# Patient Record
Sex: Male | Born: 1980
Health system: Southern US, Community
[De-identification: ages and names within clinical notes are randomized; demographics above are authoritative.]

## PROBLEM LIST (undated history)

## (undated) DIAGNOSIS — E119 Type 2 diabetes mellitus without complications: Secondary | ICD-10-CM

## (undated) HISTORY — DX: Type 2 diabetes mellitus without complications: E11.9

## (undated) HISTORY — PX: TONSILLECTOMY: SUR1361

---

## 2011-06-30 ENCOUNTER — Ambulatory Visit
Admission: RE | Admit: 2011-06-30 | Discharge: 2011-06-30 | Disposition: A | Discharge status: Home / Self Care | Payer: BC Managed Care – PPO | Source: Ambulatory Visit | Attending: Otolaryngology | Admitting: Otolaryngology

## 2011-06-30 ENCOUNTER — Other Ambulatory Visit: Payer: Self-pay | Admitting: Otolaryngology

## 2013-08-02 IMAGING — CT CT MAXILLOFACIAL W/O CM
3 series · 16 of 30 positions shown, 18 images · non-contrast
Comparison: None.

CLINICAL DATA: Chronic sinusitis.  Difficulty breathing through
nasal passages.  Nasal swelling.

CT MAXILLOFACIAL WITHOUT CONTRAST
TECHNIQUE: Multidetector CT imaging of the maxillofacial
structures was performed. Multiplanar CT image reconstructions were
also generated.

[Series 3: axial soft 1.25 · axial · 0.49mm/px · z∈[-89,-2]mm · 4 of 181 slices shown]
[im 14/181  brain]
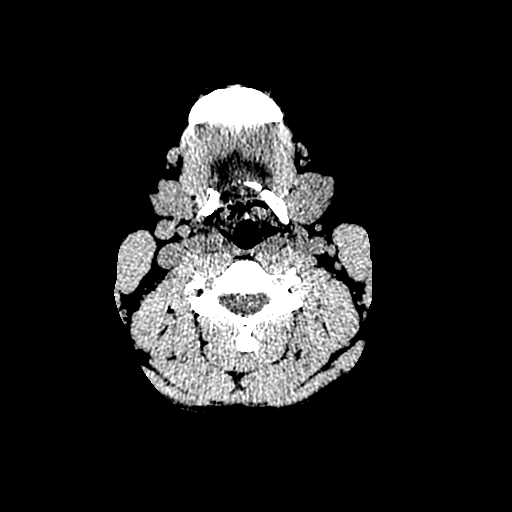
[im 42/181  brain]
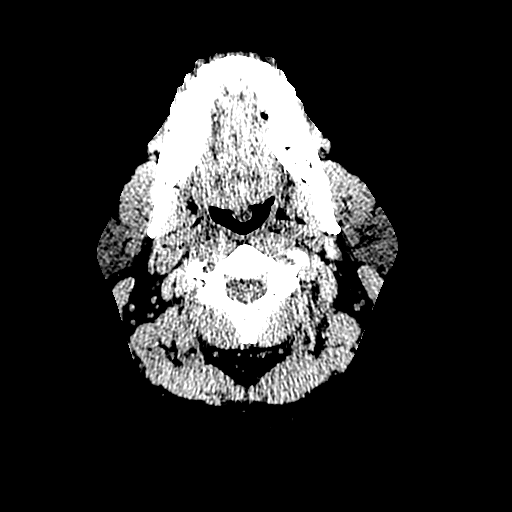
[im 56/181  brain]
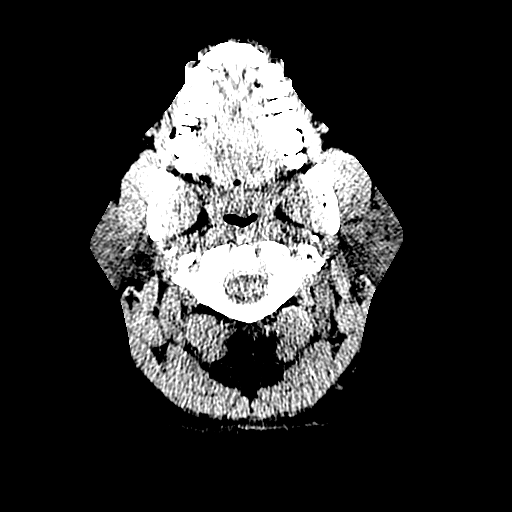
[im 84/181  brain]
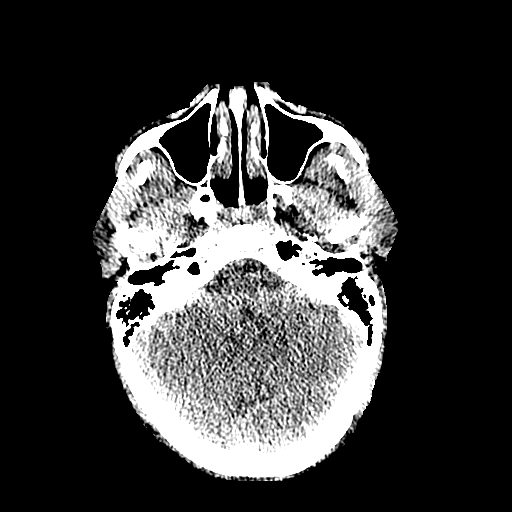

[Series 200: cor · coronal · 0.49mm/px · 7 of 120 slices shown, 9 images]
[im 15/120  brain]
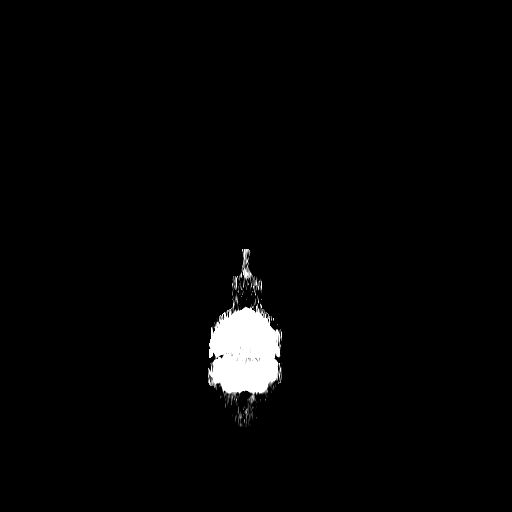
[im 15/120  bone]
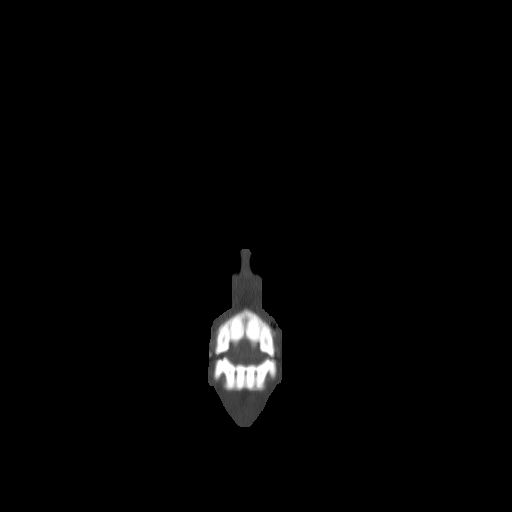
[im 30/120  bone]
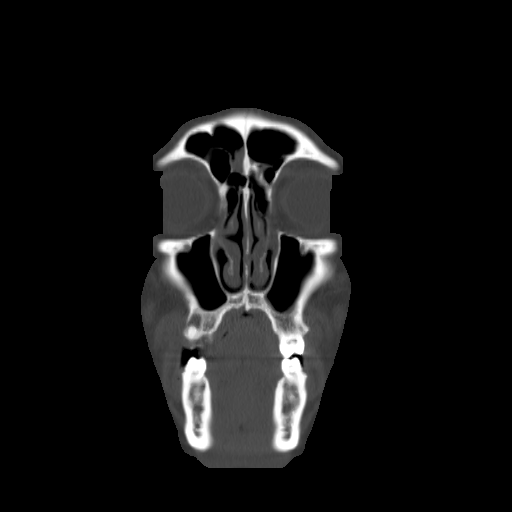
[im 45/120  bone]
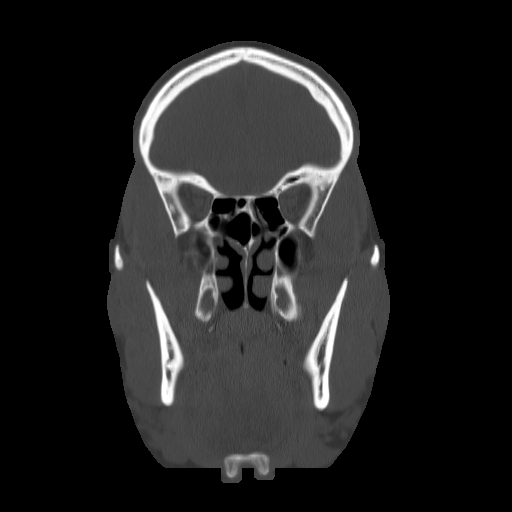
[im 60/120  bone]
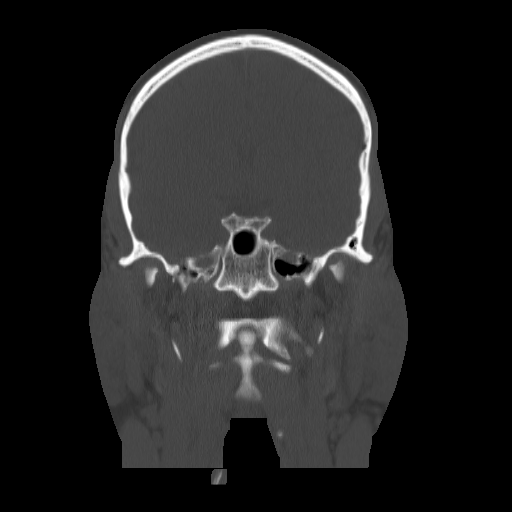
[im 75/120  brain]
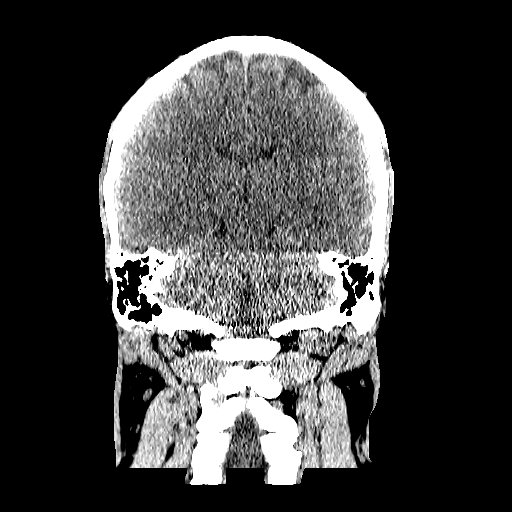
[im 75/120  bone]
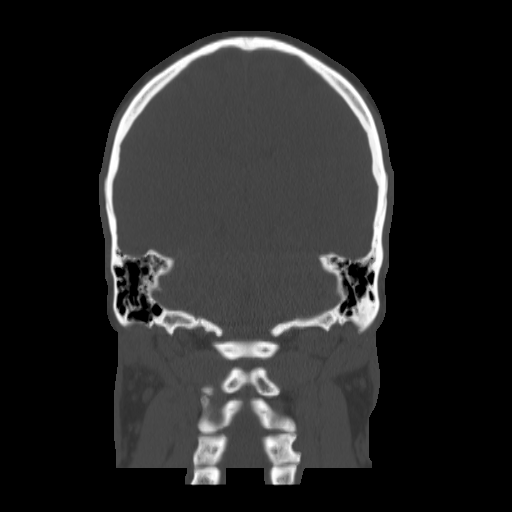
[im 90/120  bone]
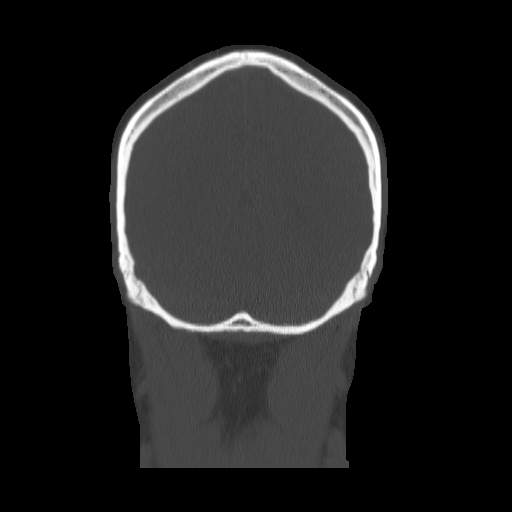
[im 105/120  bone]
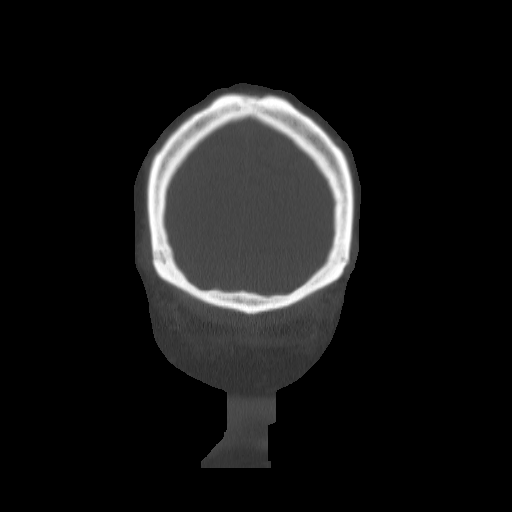

[Series 201: sag · sagittal · 0.49mm/px · 5 of 89 slices shown]
[im 15/89  bone]
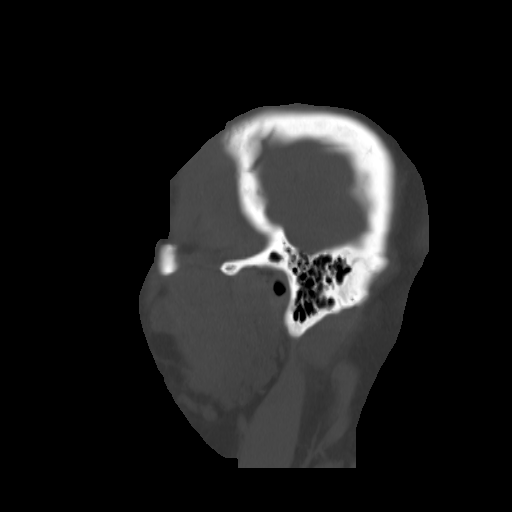
[im 30/89  bone]
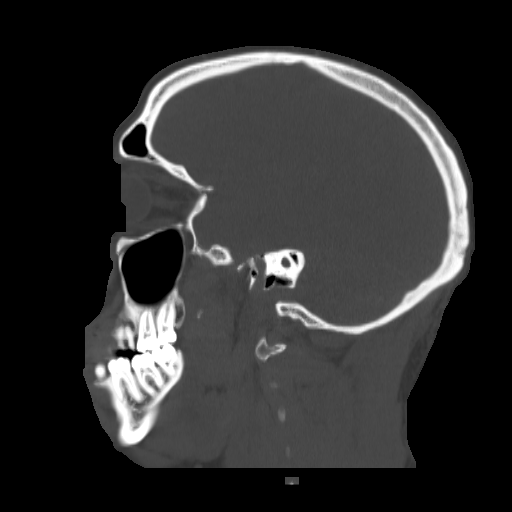
[im 45/89  bone]
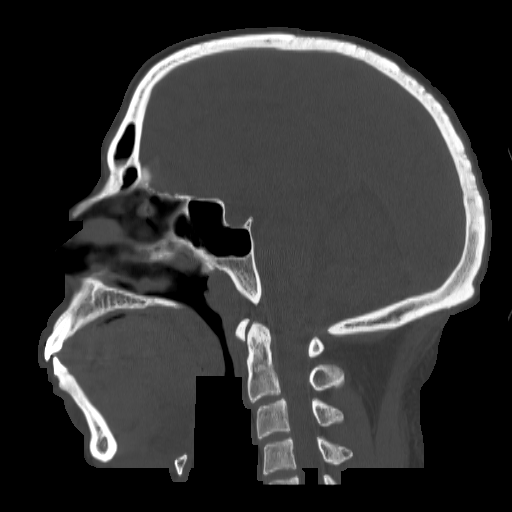
[im 59/89  bone]
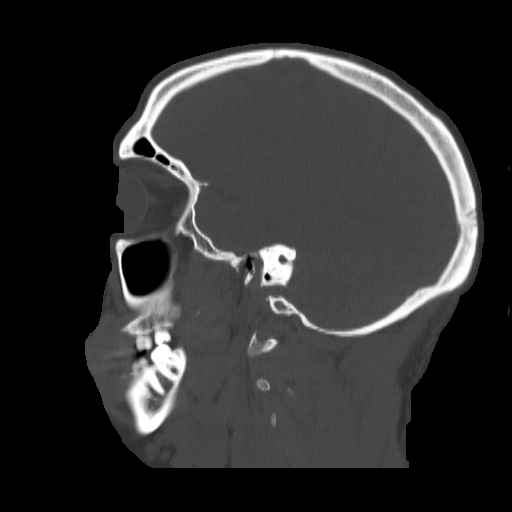
[im 74/89  bone]
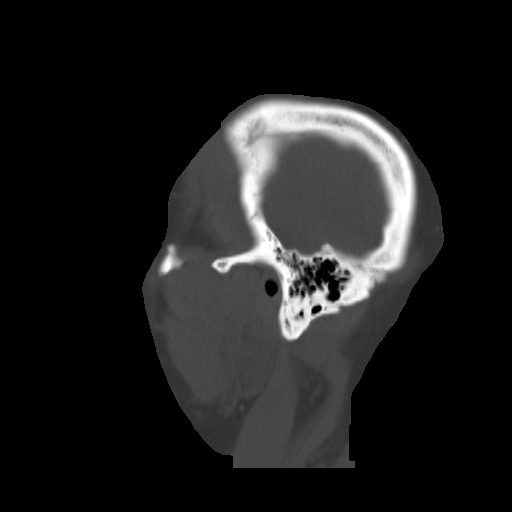

[16 of 30 positions shown; findings below may reference images not displayed]

FINDINGS: Right maxillary sinus is clear.  Minimal mucosal
thickening inferior aspect left maxillary sinus.

Right middle turbinate contra bullosa.  Slightly vertical position
of the uncinate process bilaterally.  Infundibulum appears to be
patent bilaterally.

Mid nasal spur to the left.

Prominent ethmoid sinus air cells extends into the frontal sinuses
more notable on the right.  Partial opacification medial aspect of
the frontal sinuses greater on the right.

Very minimal mucosal thickening involving portions of the ethmoid
sinus air cells

Right sphenoid sinus air cells is dominant in size with aeration of
the upper aspect of the right pterygoid plate.  No sphenoid sinus
mucosal thickening.

Right upper molar periapical lucency.

Scattered lymph nodes, largest just below the right submandibular
gland measures 1.3 x 1 x 2 cm.

Mastoid air cells and middle ear cavity are clear bilaterally.
IMPRESSION: Right maxillary sinus is clear.  Minimal mucosal thickening
inferior aspect left maxillary sinus.

Right middle turbinate contra bullosa.  Slightly vertical position
of the uncinate process bilaterally.  Infundibulum appears to be
patent bilaterally.

Mid nasal spur to the left.

Prominent ethmoid sinus air cells extends into the frontal sinuses
more notable on the right.  Partial opacification medial aspect of
the frontal sinuses greater on the right.

Very minimal mucosal thickening involving portions of the ethmoid
sinus air cells

Right sphenoid sinus air cells is dominant in size with aeration of
the upper aspect of the right pterygoid plate.  No sphenoid sinus
mucosal thickening.

Right upper molar periapical lucency.

Scattered lymph nodes, largest just below the right submandibular
gland measures 1.3 x 1 x 2 cm.

## 2014-12-09 ENCOUNTER — Emergency Department (HOSPITAL_BASED_OUTPATIENT_CLINIC_OR_DEPARTMENT_OTHER)
Admission: EM | Admit: 2014-12-09 | Discharge: 2014-12-09 | Disposition: A | Discharge status: Home / Self Care | Payer: BC Managed Care – PPO | Attending: Emergency Medicine | Admitting: Emergency Medicine

## 2014-12-09 ENCOUNTER — Encounter (HOSPITAL_BASED_OUTPATIENT_CLINIC_OR_DEPARTMENT_OTHER): Payer: Self-pay | Admitting: *Deleted

## 2014-12-09 DIAGNOSIS — J029 Acute pharyngitis, unspecified: Secondary | ICD-10-CM | POA: Insufficient documentation

## 2014-12-09 DIAGNOSIS — H109 Unspecified conjunctivitis: Secondary | ICD-10-CM | POA: Insufficient documentation

## 2014-12-09 MED ORDER — MAGIC MOUTHWASH
10.0000 mL | Freq: Once | ORAL | Status: AC
  Administered 2014-12-09: 10 mL via ORAL
  Filled 2014-12-09: qty 10

## 2014-12-09 MED ORDER — IBUPROFEN 800 MG PO TABS
800.0000 mg | ORAL_TABLET | Freq: Once | ORAL | Status: AC
  Administered 2014-12-09: 800 mg via ORAL
  Filled 2014-12-09: qty 1

## 2014-12-09 NOTE — Discharge Instructions (Signed)

## 2014-12-09 NOTE — ED Provider Notes (Signed)
CSN: [id number]     Arrival date & time 12/09/14  1719 History  By signing my name below, I, Budd PalmerVanessa Prueter, attest that this documentation has been prepared under the direction and in the presence of Margarita Grizzleanielle Anush Wiedeman, MD. Electronically Signed: Budd PalmerVanessa Prueter, ED Scribe. 12/09/2014. 6:15 PM.    Chief Complaint  Patient presents with  . Oral Swelling   The history is provided by the patient. No language interpreter was used.   HPI Comments: Brian Medina is a 34 y.o. male who presents to the Emergency Department complaining of worsening oral swelling onset 3 days ago.He reports associated swelling to the right eye, increased discharge and redness, as well as a sore throat and pain with swallowing. He states he thought he had thrush and rinsed his mouth with hydrogen peroxide and salt water. He notes he was seen for his right eye irritation through video chat at [facility], where he was prescribed eye drops for pink eye. He denies any other medical issues. He states he works at [employer] as well as part-time at [employer]. He notes he drinks alcohol socially, but has not done so in a long time. He states he is sexually active with male partners. He reports a PMHx of STDs (trichomonins) a few years ago. He notes having no sexual partners in the past year. Pt denies cough.  History reviewed. No pertinent past medical history. Past Surgical History  Procedure Laterality Date  . Tonsillectomy     No family history on file. Social History  Substance Use Topics  . Smoking status: Never Smoker   . Smokeless tobacco: Never Used  . Alcohol Use: No    Review of Systems  HENT: Positive for facial swelling, sore throat and trouble swallowing.   Eyes: Positive for pain, discharge, redness and itching.  Respiratory: Negative for cough.   All other systems reviewed and are negative.   Allergies  Other  Home Medications   Prior to Admission medications   Not on File   BP 130/92 mmHg  Pulse 104   Temp(Src) 99.8 F (37.7 C) (Oral)  Resp 20  Ht 6' (1.829 m)  Wt 140 lb (63.504 kg)  BMI 18.98 kg/m2  SpO2 98% Physical Exam  Constitutional: He is oriented to person, place, and time. He appears well-developed and well-nourished.  HENT:  Head: Normocephalic and atraumatic.  Right Ear: External ear normal.  Left Ear: External ear normal.  Nose: Nose normal.  Mouth/Throat: Oropharynx is clear and moist.  Pharyngeal erythema  Eyes: Conjunctivae and EOM are normal. Pupils are equal, round, and reactive to light.  Bilateral conjunctivitis  Neck: Normal range of motion. Neck supple.  Cardiovascular: Normal rate, regular rhythm, normal heart sounds and intact distal pulses.   Pulmonary/Chest: Effort normal and breath sounds normal. No respiratory distress. He has no wheezes. He exhibits no tenderness.  Abdominal: Soft. Bowel sounds are normal. He exhibits no distension and no mass. There is no tenderness. There is no guarding.  Musculoskeletal: Normal range of motion.  Neurological: He is alert and oriented to person, place, and time. He has normal reflexes. He exhibits normal muscle tone. Coordination normal.  Skin: Skin is warm and dry.  Psychiatric: He has a normal mood and affect. His behavior is normal. Judgment and thought content normal.  Nursing note and vitals reviewed.   ED Course  Procedures  DIAGNOSTIC STUDIES: Oxygen Saturation is 98% on RA, normal by my interpretation.    COORDINATION OF CARE: 6:13 PM - Discussed  plans to order medications to treat for probable pharyngitis. Pt advised of plan for treatment and pt agrees.  Labs Review Labs Reviewed - No data to display  Imaging Review No results found. I have personally reviewed and evaluated these images and lab results as part of my medical decision-making.   EKG Interpretation None      MDM   Final diagnoses:  Pharyngitis  Bilateral conjunctivitis   I personally performed the services described in this  documentation, which was scribed in my presence. The recorded information has been reviewed and considered.   DaniellMargarita Grizzle 12/14/14 [id number]

## 2014-12-09 NOTE — ED Notes (Signed)
Reports treatment for conjunctivitis that began on Wednesday;reports mouth pain and thought he had thrush, so he began using hydrogen peroxide and salt water. Pt now reports lip/mouth swelling; denies difficulty breathing, drooling, fever, n/v/d.

## 2014-12-09 NOTE — ED Notes (Signed)
Patient used peroxide side in mouth to help with the mouth soreness and tingling.  Patient has mouth soreness X 3 days.

## 2014-12-09 NOTE — ED Notes (Signed)
Patient stable and ambulatory.  Patient verbalizes understanding of discharge instructions. 

## 2014-12-10 ENCOUNTER — Encounter (HOSPITAL_BASED_OUTPATIENT_CLINIC_OR_DEPARTMENT_OTHER): Payer: Self-pay | Admitting: *Deleted

## 2014-12-10 ENCOUNTER — Emergency Department (HOSPITAL_BASED_OUTPATIENT_CLINIC_OR_DEPARTMENT_OTHER)
Admission: EM | Admit: 2014-12-10 | Discharge: 2014-12-10 | Disposition: A | Discharge status: Home / Self Care | Payer: BC Managed Care – PPO | Attending: Emergency Medicine | Admitting: Emergency Medicine

## 2014-12-10 DIAGNOSIS — K1379 Other lesions of oral mucosa: Secondary | ICD-10-CM | POA: Insufficient documentation

## 2014-12-10 LAB — RAPID STREP SCREEN (MED CTR MEBANE ONLY): Streptococcus, Group A Screen (Direct): NEGATIVE

## 2014-12-10 LAB — RAPID HIV SCREEN (HIV 1/2 AB+AG)
HIV 1/2 Antibodies: NONREACTIVE
HIV-1 P24 Antigen - HIV24: NONREACTIVE

## 2014-12-10 MED ORDER — ACETAMINOPHEN 325 MG PO TABS
650.0000 mg | ORAL_TABLET | Freq: Once | ORAL | Status: AC | PRN
  Administered 2014-12-10: 650 mg via ORAL

## 2014-12-10 MED ORDER — ACETAMINOPHEN 325 MG PO TABS
ORAL_TABLET | ORAL | Status: AC
  Administered 2014-12-10: 650 mg via ORAL
  Filled 2014-12-10: qty 2

## 2014-12-10 MED ORDER — MAGIC MOUTHWASH: 5.0000 mL | Freq: Three times a day (TID) | ORAL | Status: DC | PRN

## 2014-12-10 NOTE — ED Provider Notes (Signed)
CSN: [id number]     Arrival date & time 12/10/14  1807 History  By signing my name below, I, Ronney LionSuzanne Le, attest that this documentation has been prepared under the direction and in the presence of Marjory Meints, PA-C. Electronically Signed: Ronney LionSuzanne Le, ED Scribe. 12/10/2014. 7:02 PM.      Chief Complaint  Patient presents with  . Sore Throat   The history is provided by the patient. No language interpreter was used.   HPI Comments: Brian Medina is a 34 y.o. male who presents to the Emergency Department complaining of constant, worsening, burning 7/10 lip and mouth pain and swelling despite following treatment instructions after being seen here at the ED yesterday, with a diagnosis of pharyngitis. Patient was also seen for a sore throat, which he states is unchanged. He states he had taken ibuprofen, used a saltwater rinse, and pushed fluids per doctor's orders yesterday but has seen no improvements. Drinking water exacerbates the pain. He requests scraping the white plaque from his tongue to send to a lab to determine what it is.   History reviewed. No pertinent past medical history. Past Surgical History  Procedure Laterality Date  . Tonsillectomy     No family history on file. Social History  Substance Use Topics  . Smoking status: Never Smoker   . Smokeless tobacco: Never Used  . Alcohol Use: No    Review of Systems  HENT: Positive for facial swelling and sore throat.        +oral pain  All other systems reviewed and are negative.    Other  Home Medications   Prior to Admission medications   Medication Sig Start Date End Date Taking? Authorizing Provider  magic mouthwash SOLN Take 5 mLs by mouth 3 (three) times daily as needed for mouth pain. 12/10/14   Marquon Alcala C Danecia Underdown, PA-C   BP 127/79 mmHg  Pulse 115  Temp(Src) 99.7 F (37.6 C) (Oral)  Resp 20  Ht 6' (1.829 m)  Wt 240 lb (108.863 kg)  BMI 32.54 kg/m2  SpO2 96% Physical Exam  Constitutional: He is oriented to person,  place, and time. He appears well-developed and well-nourished. No distress.  HENT:  Head: Normocephalic and atraumatic.  No white patches or plaques noted. Some dryness to pt lips. Oral mucosa normal without lesions.  Eyes: Conjunctivae and EOM are normal.  Neck: Neck supple. No tracheal deviation present.  Cardiovascular: Normal rate.   Pulmonary/Chest: Effort normal. No respiratory distress.  Musculoskeletal: Normal range of motion.  Neurological: He is alert and oriented to person, place, and time.  Skin: Skin is warm and dry.  Psychiatric: He has a normal mood and affect. His behavior is normal.  Nursing note and vitals reviewed.   ED Course  Procedures (including critical care time)  DIAGNOSTIC STUDIES: Oxygen Saturation is 98% on RA, normal by my interpretation.    COORDINATION OF CARE: 6:40 PM - Discussed with pt that the ED does not normally test for tongue plaques and recommended against it. Suspect tongue irritation from high salt concentration of saltwater rinse. Advised pt to continue to gargle saltwater rather than keep it in his mouth, as well as to use a lower concentration of salt. Will give referral to PCP for pt to use as needed. Pt verbalized understanding and agreed to plan.   Labs Review Labs Reviewed  RAPID STREP SCREEN (NOT AT [facility])  CULTURE, GROUP A STREP  RAPID HIV SCREEN (HIV 1/2 AB+AG)   MDM   Final  diagnoses:  Mouth pain    Dorrien Grunder presents with mouth pain and dry lips since yesterday.  Suspect that pt may be overusing a too concentrated salt water solution. Pt advised to take a break from using the salt water. Gave prescription for magic mouthwash should he notice white plaques again, but none were present on exam. During RN interview with pt kept trying to steer conversation toward HIV and asking if he has thrush. Pt then asked if he wanted an HIV test and he said yes. Pt denies any symptoms of STD.  I personally performed the services  described in this documentation, which was scribed in my presence. The recorded information has been reviewed and is accurate.     Anselm Pancoast, PA-C 12/11/14 1421  Marily Memos, MD 12/12/14 1251

## 2014-12-10 NOTE — ED Notes (Signed)
Pt seen here yesterday and dx pharyngitis- C/o mouth pain and states he is feeling worse after following his instructions from yesterday

## 2014-12-10 NOTE — Discharge Instructions (Signed)
You have been seen today for mouth pain. Follow up with PCP as needed and for chronic care of this issue. Return to ED should symptoms worsen.

## 2014-12-13 ENCOUNTER — Encounter (HOSPITAL_BASED_OUTPATIENT_CLINIC_OR_DEPARTMENT_OTHER): Payer: Self-pay

## 2014-12-13 ENCOUNTER — Inpatient Hospital Stay (HOSPITAL_BASED_OUTPATIENT_CLINIC_OR_DEPARTMENT_OTHER)
Admission: EM | Admit: 2014-12-13 | Discharge: 2014-12-15 | DRG: 866 | Disposition: A | Discharge status: Home / Self Care | Payer: Self-pay | Attending: Internal Medicine | Admitting: Internal Medicine

## 2014-12-13 ENCOUNTER — Observation Stay (HOSPITAL_COMMUNITY): Payer: BC Managed Care – PPO

## 2014-12-13 DIAGNOSIS — R509 Fever, unspecified: Secondary | ICD-10-CM | POA: Diagnosis present

## 2014-12-13 DIAGNOSIS — B084 Enteroviral vesicular stomatitis with exanthem: Principal | ICD-10-CM | POA: Diagnosis present

## 2014-12-13 DIAGNOSIS — R042 Hemoptysis: Secondary | ICD-10-CM

## 2014-12-13 DIAGNOSIS — E876 Hypokalemia: Secondary | ICD-10-CM | POA: Diagnosis present

## 2014-12-13 DIAGNOSIS — E871 Hypo-osmolality and hyponatremia: Secondary | ICD-10-CM | POA: Diagnosis present

## 2014-12-13 DIAGNOSIS — H109 Unspecified conjunctivitis: Secondary | ICD-10-CM | POA: Diagnosis present

## 2014-12-13 LAB — COMPREHENSIVE METABOLIC PANEL
ALT: 45 U/L (ref 17–63)
AST: 50 U/L — AB (ref 15–41)
Albumin: 3.5 g/dL (ref 3.5–5.0)
Alkaline Phosphatase: 120 U/L (ref 38–126)
Anion gap: 12 (ref 5–15)
BUN: 16 mg/dL (ref 6–20)
CHLORIDE: 94 mmol/L — AB (ref 101–111)
CO2: 23 mmol/L (ref 22–32)
Calcium: 8.5 mg/dL — ABNORMAL LOW (ref 8.9–10.3)
Creatinine, Ser: 1.07 mg/dL (ref 0.61–1.24)
Glucose, Bld: 159 mg/dL — ABNORMAL HIGH (ref 65–99)
POTASSIUM: 3 mmol/L — AB (ref 3.5–5.1)
SODIUM: 129 mmol/L — AB (ref 135–145)
Total Bilirubin: 1.7 mg/dL — ABNORMAL HIGH (ref 0.3–1.2)
Total Protein: 8.2 g/dL — ABNORMAL HIGH (ref 6.5–8.1)

## 2014-12-13 LAB — CBC WITH DIFFERENTIAL/PLATELET
Band Neutrophils: 10 %
Basophils Absolute: 0.1 10*3/uL (ref 0.0–0.1)
Basophils Relative: 1 %
EOS ABS: 0.4 10*3/uL (ref 0.0–0.7)
Eosinophils Relative: 7 %
HCT: 40 % (ref 39.0–52.0)
Hemoglobin: 13.6 g/dL (ref 13.0–17.0)
LYMPHS ABS: 1.8 10*3/uL (ref 0.7–4.0)
LYMPHS PCT: 31 %
MCH: 28.5 pg (ref 26.0–34.0)
MCHC: 34 g/dL (ref 30.0–36.0)
MCV: 83.9 fL (ref 78.0–100.0)
MONOS PCT: 22 %
Metamyelocytes Relative: 2 %
Monocytes Absolute: 1.3 10*3/uL — ABNORMAL HIGH (ref 0.1–1.0)
Myelocytes: 1 %
NEUTROS PCT: 26 %
Neutro Abs: 2.2 10*3/uL (ref 1.7–7.7)
PLATELETS: 247 10*3/uL (ref 150–400)
RBC: 4.77 MIL/uL (ref 4.22–5.81)
RDW: 12.1 % (ref 11.5–15.5)
WBC: 5.8 10*3/uL (ref 4.0–10.5)

## 2014-12-13 LAB — I-STAT VENOUS BLOOD GAS, ED
Acid-Base Excess: 7 mmol/L — ABNORMAL HIGH (ref 0.0–2.0)
BICARBONATE: 28.8 meq/L — AB (ref 20.0–24.0)
O2 Saturation: 92 %
PO2 VEN: 64 mmHg — AB (ref 30.0–45.0)
Patient temperature: 103.2
TCO2: 30 mmol/L (ref 0–100)
pCO2, Ven: 36.5 mmHg — ABNORMAL LOW (ref 45.0–50.0)
pH, Ven: 7.513 — ABNORMAL HIGH (ref 7.250–7.300)

## 2014-12-13 LAB — CULTURE, GROUP A STREP: Strep A Culture: NEGATIVE

## 2014-12-13 LAB — DIC (DISSEMINATED INTRAVASCULAR COAGULATION)PANEL
D-Dimer, Quant: 0.94 ug/mL-FEU — ABNORMAL HIGH (ref 0.00–0.48)
INR: 1.24 (ref 0.00–1.49)
Platelets: 246 10*3/uL (ref 150–400)
Smear Review: NONE SEEN

## 2014-12-13 LAB — DIC (DISSEMINATED INTRAVASCULAR COAGULATION) PANEL
APTT: 32 s (ref 24–37)
PROTHROMBIN TIME: 15.8 s — AB (ref 11.6–15.2)

## 2014-12-13 LAB — I-STAT CG4 LACTIC ACID, ED: LACTIC ACID, VENOUS: 1.28 mmol/L (ref 0.5–2.0)

## 2014-12-13 LAB — TROPONIN I: Troponin I: <0.03 ng/mL (ref <0.031)

## 2014-12-13 LAB — RAPID STREP SCREEN (MED CTR MEBANE ONLY): Streptococcus, Group A Screen (Direct): NEGATIVE

## 2014-12-13 MED ORDER — ACETAMINOPHEN 160 MG/5ML PO SOLN
650.0000 mg | Freq: Once | ORAL | Status: AC
  Administered 2014-12-13: 650 mg via ORAL
  Filled 2014-12-13: qty 20.3

## 2014-12-13 MED ORDER — ONDANSETRON HCL 4 MG/2ML IJ SOLN: 4.0000 mg | Freq: Four times a day (QID) | INTRAMUSCULAR | Status: DC | PRN

## 2014-12-13 MED ORDER — CETYLPYRIDINIUM CHLORIDE 0.05 % MT LIQD: 7.0000 mL | Freq: Two times a day (BID) | OROMUCOSAL | Status: DC

## 2014-12-13 MED ORDER — POTASSIUM CHLORIDE 10 MEQ/100ML IV SOLN
10.0000 meq | INTRAVENOUS | Status: AC
  Administered 2014-12-13 (×3): 10 meq via INTRAVENOUS
  Filled 2014-12-13 (×2): qty 100

## 2014-12-13 MED ORDER — ACETAMINOPHEN 325 MG PO TABS: 650.0000 mg | ORAL_TABLET | Freq: Four times a day (QID) | ORAL | Status: DC | PRN

## 2014-12-13 MED ORDER — ACETAMINOPHEN 650 MG RE SUPP
650.0000 mg | Freq: Four times a day (QID) | RECTAL | Status: DC | PRN
Start: 2014-12-13 — End: 2014-12-15

## 2014-12-13 MED ORDER — CHLORHEXIDINE GLUCONATE 0.12 % MT SOLN
15.0000 mL | Freq: Two times a day (BID) | OROMUCOSAL | Status: DC
  Administered 2014-12-13 – 2014-12-15 (×4): 15 mL via OROMUCOSAL
  Filled 2014-12-13 (×4): qty 15

## 2014-12-13 MED ORDER — ONDANSETRON HCL 4 MG PO TABS: 4.0000 mg | ORAL_TABLET | Freq: Four times a day (QID) | ORAL | Status: DC | PRN

## 2014-12-13 MED ORDER — SODIUM CHLORIDE 0.9 % IJ SOLN
3.0000 mL | Freq: Two times a day (BID) | INTRAMUSCULAR | Status: DC
  Administered 2014-12-13 – 2014-12-14 (×2): 3 mL via INTRAVENOUS

## 2014-12-13 MED ORDER — SODIUM CHLORIDE 0.9 % IV SOLN
INTRAVENOUS | Status: DC
  Administered 2014-12-13 – 2014-12-15 (×3): via INTRAVENOUS

## 2014-12-13 MED ORDER — SODIUM CHLORIDE 0.9 % IV BOLUS (SEPSIS)
1000.0000 mL | Freq: Once | INTRAVENOUS | Status: AC
  Administered 2014-12-13: 1000 mL via INTRAVENOUS

## 2014-12-13 NOTE — H&P (Signed)
[facility] History and Physical  Dwane Andres XBJ:[id number] DOB: [birth year] DOA: 12/13/2014  Referring physician: er- [facility] PCP: No PCP Per Patient   Chief Complaint: sore throat, pain in mouth  HPI: Brian Medina is a 34 y.o. male  Who started 1 week ago with a sore throat.  He was strep negative when seen in the ER.  He the developed conjunctivitis and mouth sores.  Today, he presents coughing up mucous covered in blood.  He has bleeding in his mouth and sores.  There is desquamation of his lips and mouth.  He has fever of 103.  He also had rash appearing on hand and legs/feet.   In the ER, strep negative, HIV negative.  Works at [employer], no children.  No sick contacts, no camping or rabbit exposure.  No high risk sexual behavior.    Patient was transferred from [facility] to [facility] for evaluation   Review of Systems:  All systems reviewed, negative unless stated above   History reviewed. No pertinent past medical history. Past Surgical History  Procedure Laterality Date  . Tonsillectomy     Social History:  reports that he has never smoked. He has never used smokeless tobacco. He reports that he does not drink alcohol or use illicit drugs.  Allergies  Allergen Reactions  . Other Anaphylaxis    Mushrooms    Family Hx: +HTN  Prior to Admission medications   Medication Sig Start Date End Date Taking? Authorizing Provider  magic mouthwash SOLN Take 5 mLs by mouth 3 (three) times daily as needed for mouth pain. 12/10/14   Anselm Pancoast, PA-C   Physical Exam: Filed Vitals:   12/13/14 1422 12/13/14 1445 12/13/14 1530 12/13/14 1627  BP:  133/86 136/87 135/86  Pulse:  114 103 115  Temp: 99.5 F (37.5 C)   99.5 F (37.5 C)  TempSrc: Oral   Oral  Resp:  Height:      Weight:      SpO2:  98% 98% 100%    Wt Readings from Last 3 Encounters:  12/13/14 108.863 kg (240 lb)  12/10/14 108.863 kg (240 lb)  12/09/14 63.504 kg (140 lb)    General:  Appears  uncomfortable Eyes: conjunctiva red and watery ENT: grossly normal hearing, lips & tongue Neck: no LAD, masses or thyromegaly Cardiovascular: tachy, No LE edema. Respiratory: no wheezing Abdomen: soft, ntnd Skin: rash on hands and B/l feet/lower legs- flat/red with gray center and non-itchy Musculoskeletal: grossly normal tone BUE/BLE Psychiatric: grossly normal mood and affect, speech fluent and appropriate Neurologic: grossly non-focal.          Labs on Admission:  Basic Metabolic Panel:  Recent Labs Lab 12/13/14 1245  NA 129*  K 3.0*  CL 94*  CO2 23  GLUCOSE 159*  BUN 16  CREATININE 1.07  CALCIUM 8.5*   Liver Function Tests:  Recent Labs Lab 12/13/14 1245  AST 50*  ALT 45  ALKPHOS 120  BILITOT 1.7*  PROT 8.2*  ALBUMIN 3.5   No results for input(s): LIPASE, AMYLASE in the last 168 hours. No results for input(s): AMMONIA in the last 168 hours. CBC:  Recent Labs Lab 12/13/14 1245  WBC 5.8  NEUTROABS 2.2  HGB 13.6  HCT 40.0  MCV 83.9  PLT 247  246   Cardiac Enzymes:  Recent Labs Lab 12/13/14 1245  TROPONINI <0.03    BNP (last 3 results) No results for input(s): BNP in the last 8760 hours.  ProBNP (last 3 results) No results for input(s): PROBNP in the last 8760 hours.  CBG: No results for input(s): GLUCAP in the last 168 hours.  Radiological Exams on Admission: No results found.    Assessment/Plan Active Problems:   Fever   Hand, foot and mouth disease   coxsackie virus/HFM-- IVF, symptomatic treatment -HIV neg -RPR pending -GC pending -chest x ray -suspect has the conjunctival hemorrhage as well  Fever -see above  Hyponatremia IVF -recheck in AM  Hypokalemia -replete  Code Status: full DVT Prophylaxis: Family Communication: patient/wife Disposition Plan:   Time spent: 75 min  Marlin CanaryVANN, Thersa Mohiuddin [facility] Pager [phone]

## 2014-12-13 NOTE — ED Provider Notes (Signed)
CSN: [id number]     Arrival date & time 12/13/14  1150 History   First MD Initiated Contact with Patient 12/13/14 1212     Chief Complaint  Patient presents with  . Hemoptysis     (Consider location/radiation/quality/duration/timing/severity/associated sxs/prior Treatment) HPI   Patient is a 34 year old male presented to the emergency department with several symptoms. Patient was first seen one week ago for sore throat. Negative strep. Was then seen again 4 days later for conjunctivitis and ? thrush. Patient was treated for conjunctivitis.  Patient appears today with bilateral chemosis, bleeding from the oropharynx with desquamation of lips and mouth.  In addition patient has rash on hands and soles. Patient also has fever to 103.  Patient has no real past medical history. Has had 2 negative HIV is in the last week.  Denies any new medications except for conjunctivitis medication. Denies any travel. Patient was born [place] the [place], up-to-date on vaccinations. Patient denies any sexually transmitted disease symptoms. Including no dysuria or discharge.  History reviewed. No pertinent past medical history. Past Surgical History  Procedure Laterality Date  . Tonsillectomy     No family history on file. Social History  Substance Use Topics  . Smoking status: Never Smoker   . Smokeless tobacco: Never Used  . Alcohol Use: No    Review of Systems  Constitutional: Positive for fever, activity change and fatigue.  HENT: Positive for mouth sores, sore throat and trouble swallowing.   Eyes: Positive for discharge and redness.  Respiratory: Negative for shortness of breath.   Cardiovascular: Negative for chest pain.  Gastrointestinal: Negative for abdominal pain.  Genitourinary: Negative for discharge, difficulty urinating and genital sores.  Musculoskeletal: Negative for myalgias, arthralgias and neck pain.  Allergic/Immunologic: Negative for immunocompromised state.  Neurological: Negative  for dizziness, weakness and numbness.  Hematological: Positive for adenopathy.  Psychiatric/Behavioral: Negative for confusion.      Allergies  Other  Home Medications   Prior to Admission medications   Medication Sig Start Date End Date Taking? Authorizing Provider  magic mouthwash SOLN Take 5 mLs by mouth 3 (three) times daily as needed for mouth pain. 12/10/14   Shawn C Joy, PA-C   BP 136/87 mmHg  Pulse 103  Temp(Src) 99.5 F (37.5 C) (Oral)  Resp 14  Ht 6' (1.829 m)  Wt 240 lb (108.863 kg)  BMI 32.54 kg/m2  SpO2 98% Physical Exam  Constitutional: He is oriented to person, place, and time. He appears well-nourished.  HENT:  Head: Normocephalic.  Mouth/Throat: Oropharynx is clear and moist.  Bilateral positive chemosis of eyes. Scant discharge. Mild proptosis  Lips are peeling. Oral mucosa shows peeled skin, blood on tongue and all surfaces.  Positive adenopathy  Eyes: Conjunctivae are normal.  Neck: No tracheal deviation present.  Cardiovascular: Normal rate.   Pulmonary/Chest: Effort normal. No stridor. No respiratory distress.  Abdominal: Soft. There is no tenderness. There is no guarding.  Musculoskeletal: Normal range of motion. He exhibits no edema.  Neurological: He is oriented to person, place, and time. No cranial nerve deficit.  Skin: Skin is warm and dry. He is not diaphoretic.  Multiple small erythematous round less than 1 cm lesions  Some of them have  central darkening, gray.  Located scattered on palmar surfaces and soles of feet  Psychiatric: He has a normal mood and affect. His behavior is normal.  Nursing note and vitals reviewed.   ED Course  Procedures (including critical care time) Labs Review Labs Reviewed  CBC WITH DIFFERENTIAL/PLATELET - Abnormal; Notable for the following:    Monocytes Absolute 1.3 (*)    All other components within normal limits  COMPREHENSIVE METABOLIC PANEL - Abnormal; Notable for the following:    Sodium 129  (*)    Potassium 3.0 (*)    Chloride 94 (*)    Glucose, Bld 159 (*)    Calcium 8.5 (*)    Total Protein 8.2 (*)    AST 50 (*)    Total Bilirubin 1.7 (*)    All other components within normal limits  DIC (DISSEMINATED INTRAVASCULAR COAGULATION) PANEL - Abnormal; Notable for the following:    Prothrombin Time 15.8 (*)    Fibrinogen >800 (*)    D-Dimer, Quant 0.94 (*)    All other components within normal limits  I-STAT VENOUS BLOOD GAS, ED - Abnormal; Notable for the following:    pH, Ven 7.513 (*)    pCO2, Ven 36.5 (*)    pO2, Ven 64.0 (*)    Bicarbonate 28.8 (*)    Acid-Base Excess 7.0 (*)    All other components within normal limits  RAPID STREP SCREEN (NOT AT [facility])  CULTURE, BLOOD (ROUTINE X 2)  CULTURE, BLOOD (ROUTINE X 2)  GONOCOCCUS CULTURE  CULTURE, GROUP A STREP  TROPONIN I  BLOOD GAS, VENOUS  ANTISTREPTOLYSIN O TITER  RPR  ROCKY MTN SPOTTED FVR ABS PNL(IGG+IGM)  I-STAT CG4 LACTIC ACID, ED  GC/CHLAMYDIA PROBE AMP ([place]) NOT AT [facility]    Imaging Review No results found. I have personally reviewed and evaluated these images and lab results as part of my medical decision-making.   EKG Interpretation   Date/Time:  Wednesday December 13 2014 12:46:56 EDT Ventricular Rate:  124 PR Interval:  136 QRS Duration: 100 QT Interval:  340 QTC Calculation: 488 R Axis:   60 Text Interpretation:  Sinus tachycardia Otherwise normal ECG no acute  ischemia Sinus tachycardia Confirmed by Kandis Mannan ([id number]) on  12/13/2014 2:08:26 PM      MDM   Final diagnoses:  Fever, unspecified fever cause   patient is a 34 year old ill-appearing male presenting with bilateral eye ecchymosis, desquamating of oral pharynx and rash on the soles and palmar surfaces.  Differential includes, disseminated gonococcal, measles, secondary syphilis, Rocky spotted fever, SJS/TN sepsis from another source. We will give fluids, order DIC panel, widespread of labs including these  infectious disease labs to rule out diseases as aforementioned. ` We'll consult infectious disease.   Will admit to hosptilalist with ID following.              Carrieann Spielberg Randall An, MD 12/13/14 1544

## 2014-12-13 NOTE — ED Notes (Signed)
C/o coughing/spitting up blood this Sunday-also c/o "sores in mouth" and cont'd redness to both eyes

## 2014-12-14 DIAGNOSIS — R509 Fever, unspecified: Secondary | ICD-10-CM

## 2014-12-14 DIAGNOSIS — R070 Pain in throat: Secondary | ICD-10-CM

## 2014-12-14 DIAGNOSIS — R5381 Other malaise: Secondary | ICD-10-CM

## 2014-12-14 LAB — BASIC METABOLIC PANEL
Anion gap: 13 (ref 5–15)
BUN: 10 mg/dL (ref 6–20)
CHLORIDE: 99 mmol/L — AB (ref 101–111)
CO2: 21 mmol/L — AB (ref 22–32)
Calcium: 8.5 mg/dL — ABNORMAL LOW (ref 8.9–10.3)
Creatinine, Ser: 1.03 mg/dL (ref 0.61–1.24)
GFR calc Af Amer: >60 mL/min (ref >60)
GFR calc non Af Amer: >60 mL/min (ref >60)
GLUCOSE: 118 mg/dL — AB (ref 65–99)
POTASSIUM: 3.6 mmol/L (ref 3.5–5.1)
Sodium: 133 mmol/L — ABNORMAL LOW (ref 135–145)

## 2014-12-14 LAB — CBC
HEMATOCRIT: 39.9 % (ref 39.0–52.0)
Hemoglobin: 13.8 g/dL (ref 13.0–17.0)
MCH: 29.4 pg (ref 26.0–34.0)
MCHC: 34.6 g/dL (ref 30.0–36.0)
MCV: 85.1 fL (ref 78.0–100.0)
Platelets: 272 10*3/uL (ref 150–400)
RBC: 4.69 MIL/uL (ref 4.22–5.81)
RDW: 12.6 % (ref 11.5–15.5)
WBC: 7.9 10*3/uL (ref 4.0–10.5)

## 2014-12-14 LAB — RPR: RPR Ser Ql: NONREACTIVE

## 2014-12-14 LAB — MONONUCLEOSIS SCREEN: MONO SCREEN: NEGATIVE

## 2014-12-14 LAB — ANTISTREPTOLYSIN O TITER: ASO: 326 IU/mL — ABNORMAL HIGH (ref 0.0–200.0)

## 2014-12-14 LAB — ROCKY MTN SPOTTED FVR ABS PNL(IGG+IGM)
RMSF IgG: NEGATIVE
RMSF IgM: 0.32 index (ref 0.00–0.89)

## 2014-12-14 NOTE — Consult Note (Signed)
[facility]       Reason for Consult: conjunctivitis, rash    Referring Physician: Dr. Benjamine MolaVann  Active Problems:   Fever   Hand, foot and mouth disease   Hypokalemia   Hyponatremia   . antiseptic oral rinse  7 mL Mouth Rinse q12n4p  . chlorhexidine  15 mL Mouth Rinse BID  . sodium chloride  3 mL Intravenous Q12H    Recommendations: Supportive care   Assessment: He has likely hand/foot/mouth disease or adenovirus.  Doubt Steven's Laural BenesJohnson since he has not been on any medication other than NSAIDS/ acetaminophen taken after this started.  No c/w RMSF.   Antibiotics: none  HPI: Brian Medina is a 34 y.o. male with no significant past medical history, not on any medications, who [facility] for throat pain and buring in mouth.  Noted fever up to 103 yesterday.  Had been ongoing for about 1-2 days prior to initial visit.  First fever was yesterday.  No sick contacts but does work in [employer] around people.  No kids.  No history of this.  Discomfort with swallowing though now seems to be improving. Today eyes are less red.  Last fever 100.4 yesterday evening. RPR negative, no significant lab abnormalities, ASO up.  HIV negative.   CXR independently reviewed and no acute lung issues, some gas in stomach.    Review of Systems:  Constitutional: positive for fevers and malaise Eyes: positive for redness Gastrointestinal: negative for vomiting and diarrhea Hematologic/lymphatic: negative for lymphadenopathy All other systems reviewed and are negative   History reviewed. No pertinent past medical history.  Social History  Substance Use Topics  . Smoking status: Never Smoker   . Smokeless tobacco: Never Used  . Alcohol Use: No    No family history on file.  Allergies  Allergen Reactions  . Other Anaphylaxis    Mushrooms  . Mushroom Extract Complex     Physical Exam: Constitutional: in no apparent distress and alert  Filed  Vitals:   12/14/14 1027  BP: 142/78  Pulse: 120  Temp:   Resp: 20   EYES: anicteric ENMT:no thrush Cardiovascular: Cor RRR and No murmurs Respiratory: clear to auscultation; normal respiratory effort GI: Bowel sounds are normal Musculoskeletal: peripheral pulses normal, no pedal edema, no clubbing or cyanosis Skin: --- POSITIVES: rash on palms and soles Hematologic: no cervical, supraclavicular lad  Lab Results  Component Value Date   WBC 7.9 12/14/2014   HGB 13.8 12/14/2014   HCT 39.9 12/14/2014   MCV 85.1 12/14/2014   PLT 272 12/14/2014    Lab Results  Component Value Date   CREATININE 1.03 12/14/2014   BUN 10 12/14/2014   NA 133* 12/14/2014   K 3.6 12/14/2014   CL 99* 12/14/2014   CO2 21* 12/14/2014    Lab Results  Component Value Date   ALT 45 12/13/2014   AST 50* 12/13/2014   ALKPHOS 120 12/13/2014     Microbiology: Recent Results (from the past 240 hour(s))  Rapid strep screen (not at [facility])     Status: None   Collection Time: 12/10/14  6:23 PM  Result Value Ref Range Status   Streptococcus, Group A Screen (Direct) NEGATIVE NEGATIVE Final    Comment: (NOTE) A Rapid Antigen test may result negative if the antigen level in the sample is below the detection level of this test. The FDA has not cleared this test as a stand-alone test therefore the rapid  antigen negative result has reflexed to a Group A Strep culture.   Culture, Group A Strep     Status: None   Collection Time: 12/10/14  6:23 PM  Result Value Ref Range Status   Strep A Culture Negative  Final    Comment: (NOTE) Performed At: [facility] [address] [place], [place] [id number] [name] MD WU:[id number]   Rapid strep screen     Status: None   Collection Time: 12/13/14 12:55 PM  Result Value Ref Range Status   Streptococcus, Group A Screen (Direct) NEGATIVE NEGATIVE Final    Comment: (NOTE) A Rapid Antigen test may result negative if the antigen level in the sample is  below the detection level of this test. The FDA has not cleared this test as a stand-alone test therefore the rapid antigen negative result has reflexed to a Group A Strep culture.     Staci Righter, MD [facility] [email or web address] [phone] pager  [phone] cell 12/14/2014, 12:03 PM

## 2014-12-14 NOTE — Care Management Note (Signed)
Case Management Note  Patient Details  Name: Noland Fordyceicholas Eisenberg MRN: [id number] Date of Birth: 04-01-1980  Subjective/Objective:    CM following for progression and d/c planning.                Action/Plan: 12/14/2014 [facility], however pt will need to be [facility] due to the [facility]. Appointment is Tuesday Dec 26, 2014 @ 9am. Pt will be [facility] to [facility] at the time of D/C if needed.   Expected Discharge Date:                  Expected Discharge Plan:  Home/Self Care  In-House Referral:  NA  Discharge planning Services  CM Consult, Indigent Health Clinic, Medication Assistance  Post Acute Care Choice:  NA Choice offered to:  NA  DME Arranged:    DME Agency:     HH Arranged:    HH Agency:     Status of Service:  In process, will continue to follow  Medicare Important Message Given:    Date Medicare IM Given:    Medicare IM give by:    Date Additional Medicare IM Given:    Additional Medicare Important Message give by:     If discussed at Long Length of Stay Meetings, dates discussed:    Additional Comments:  Starlyn SkeansRoyal, Annelie Boak U, RN 12/14/2014, 3:22 PM

## 2014-12-14 NOTE — Progress Notes (Signed)
PROGRESS NOTE  Brian Fordyceicholas Medina [id number] DOB: February 29, 1980 DOA: 12/13/2014 PCP: No PCP Per Patient  Assessment/Plan: coxsackie virus/HFM-- IVF, symptomatic treatment -HIV neg -RPR neg -GC pending -chest x ray negative -suspect has the conjunctival hemorrhage component of coxsackie as well  Fever -see above  Hyponatremia IVF  Hypokalemia -repleted  Code Status: full Family Communication: patient Disposition Plan: home later today or in AM   Consultants:    Procedures:      HPI/Subjective: Mouth improving, able to swallow better  Objective: Filed Vitals:   12/14/14 0539  BP: 140/75  Pulse: 132  Temp: 98.9 F (37.2 C)  Resp: 18    Intake/Output Summary (Last 24 hours) at 12/14/14 1019 Last data filed at 12/14/14 0900  Gross per 24 hour  Intake 2457.5 ml  Output    200 ml  Net 2257.5 ml   Filed Weights   12/13/14 1156 12/13/14 2017  Weight: 108.863 kg (240 lb) 108.496 kg (239 lb 3 oz)    Exam:   General:  Appears more comfortable, mouth with less bleeding  Cardiovascular: rrr  Respiratory: clear  Abdomen: +BS, soft  Musculoskeletal: no edema   Data Reviewed: Basic Metabolic Panel:  Recent Labs Lab 12/13/14 1245 12/14/14 0530  NA 129* 133*  K 3.0* 3.6  CL 94* 99*  CO2 23 21*  GLUCOSE 159* 118*  BUN 16 10  CREATININE 1.07 1.03  CALCIUM 8.5* 8.5*   Liver Function Tests:  Recent Labs Lab 12/13/14 1245  AST 50*  ALT 45  ALKPHOS 120  BILITOT 1.7*  PROT 8.2*  ALBUMIN 3.5   No results for input(s): LIPASE, AMYLASE in the last 168 hours. No results for input(s): AMMONIA in the last 168 hours. CBC:  Recent Labs Lab 12/13/14 1245 12/14/14 0530  WBC 5.8 7.9  NEUTROABS 2.2  --   HGB 13.6 13.8  HCT 40.0 39.9  MCV 83.9 85.1  PLT 247  246 272   Cardiac Enzymes:  Recent Labs Lab 12/13/14 1245  TROPONINI <0.03   BNP (last 3 results) No results for input(s): BNP in the last 8760 hours.  ProBNP (last 3  results) No results for input(s): PROBNP in the last 8760 hours.  CBG: No results for input(s): GLUCAP in the last 168 hours.  Recent Results (from the past 240 hour(s))  Rapid strep screen (not at [facility])     Status: None   Collection Time: 12/10/14  6:23 PM  Result Value Ref Range Status   Streptococcus, Group A Screen (Direct) NEGATIVE NEGATIVE Final    Comment: (NOTE) A Rapid Antigen test may result negative if the antigen level in the sample is below the detection level of this test. The FDA has not cleared this test as a stand-alone test therefore the rapid antigen negative result has reflexed to a Group A Strep culture.   Culture, Group A Strep     Status: None   Collection Time: 12/10/14  6:23 PM  Result Value Ref Range Status   Strep A Culture Negative  Final    Comment: (NOTE) Performed At: [facility] [address] [place], [place] [id number] Mila HomerHancock William F MD MW:[id number]:[phone]   Rapid strep screen     Status: None   Collection Time: 12/13/14 12:55 PM  Result Value Ref Range Status   Streptococcus, Group A Screen (Direct) NEGATIVE NEGATIVE Final    Comment: (NOTE) A Rapid Antigen test may result negative if the antigen level in the sample is below the detection level  of this test. The FDA has not cleared this test as a stand-alone test therefore the rapid antigen negative result has reflexed to a Group A Strep culture.      Studies: Dg Chest 2 View  12/13/2014  CLINICAL DATA:  Hemoptysis. EXAM: CHEST  2 VIEW COMPARISON:  None. FINDINGS: Normal heart size. Normal mediastinal contour. No pneumothorax. No pleural effusion. Clear lungs, with no focal lung consolidation and no pulmonary edema. IMPRESSION: No active cardiopulmonary disease. Electronically Signed   By: Delbert Phenix M.D.   On: 12/13/2014 21:27    Scheduled Meds: . antiseptic oral rinse  7 mL Mouth Rinse q12n4p  . chlorhexidine  15 mL Mouth Rinse BID  . sodium chloride  3 mL Intravenous Q12H    Continuous Infusions: . sodium chloride 125 mL/hr at 12/14/14 0500   Antibiotics Given (last 72 hours)    None      Active Problems:   Fever   Hand, foot and mouth disease   Hypokalemia   Hyponatremia    Time spent: 25 min    Brian Medina  [facility] Pager [phone]. If 7PM-7AM, please contact night-coverage at [email or web address], password [facility] 12/14/2014, 10:19 AM

## 2014-12-15 LAB — GC/CHLAMYDIA PROBE AMP (~~LOC~~) NOT AT ARMC
Chlamydia: NEGATIVE
NEISSERIA GONORRHEA: NEGATIVE

## 2014-12-15 LAB — CULTURE, GROUP A STREP: STREP A CULTURE: NEGATIVE

## 2014-12-15 LAB — HIV-1 RNA QUANT-NO REFLEX-BLD
HIV 1 RNA Quant: <20 copies/mL
LOG10 HIV-1 RNA: UNDETERMINED log10copy/mL

## 2014-12-15 MED ORDER — POLYVINYL ALCOHOL 1.4 % OP SOLN
1.0000 [drp] | OPHTHALMIC | Status: DC | PRN
  Filled 2014-12-15: qty 15

## 2014-12-15 MED ORDER — HYPROMELLOSE (GONIOSCOPIC) 2.5 % OP SOLN
1.0000 [drp] | OPHTHALMIC | Status: DC | PRN
  Filled 2014-12-15: qty 15

## 2014-12-15 NOTE — Discharge Summary (Signed)
Physician Discharge Summary  Brian Medina JXB:[id number] DOB: [birth year] DOA: 12/13/2014  PCP: No PCP Per Patient  Admit date: 12/13/2014 Discharge date: 12/15/2014  Time spent: 35 minutes  Recommendations for Outpatient Follow-up:  1. Establish with PCP  Discharge Diagnoses:  Active Problems:   Fever   Hand, foot and mouth disease   Hypokalemia   Hyponatremia   Discharge Condition: improved  Diet recommendation: as tolerated  Filed Weights   12/13/14 1156 12/13/14 2017 12/14/14 1027  Weight: 108.863 kg (240 lb) 108.496 kg (239 lb 3 oz) 108.496 kg (239 lb 3 oz)    History of present illness:  Brian Medina is a 34 y.o. male  Who started 1 week ago with a sore throat. He was strep negative when seen in the ER. He the developed conjunctivitis and mouth sores. Today, he presents coughing up mucous covered in blood. He has bleeding in his mouth and sores. There is desquamation of his lips and mouth. He has fever of 103. He also had rash appearing on hand and legs/feet.   In the ER, strep negative, HIV negative. Works at [employer], no children. No sick contacts, no camping or rabbit exposure. No high risk sexual behavior.   Patient was [facility] from [facility] to [facility] for evaluation   Hospital Course:  coxsackie virus/HFM-- IVF, symptomatic treatment -HIV neg -RPR neg -GC neg -chest x ray negative -suspect has the conjunctival hemorrhage component of coxsackie as well  Fever -see above  Hyponatremia IVF  Hypokalemia -repleted  ID consult  Discharge Exam: Filed Vitals:   12/15/14 0638  BP: 128/77  Pulse: 102  Temp: 98.7 F (37.1 C)  Resp: 18    General: awake, NAD   Discharge Instructions   Discharge Instructions    Discharge instructions    Complete by:  As directed   Soft foods Drink plenty of non-acidic fluid     Increase activity slowly    Complete by:  As directed           Current Discharge Medication List    CONTINUE these  medications which have NOT CHANGED   Details  acetaminophen (TYLENOL) 325 MG tablet Take 650 mg by mouth every 6 (six) hours as needed (pain).    ibuprofen (ADVIL,MOTRIN) 200 MG tablet Take 200 mg by mouth every 6 (six) hours as needed (pain).    magic mouthwash SOLN Take 5 mLs by mouth 3 (three) times daily as needed for mouth pain. Qty: 15 mL, Refills: 0      STOP taking these medications     Cold Sore Products (BLISTEX MEDICATED EX)      diphenhydrAMINE (BENADRYL) 12.5 MG/5ML liquid      trimethoprim-polymyxin b (POLYTRIM) ophthalmic solution        Allergies  Allergen Reactions  . Other Anaphylaxis    Mushrooms  . Mushroom Extract Complex    Follow-up Information    Follow up with [facility].   Why:  [facility] ph: [phone]  Appointment Tuesday, Dec 26, 2014 at [facility] information:   [address] [place] [address]        The results of significant diagnostics from this hospitalization (including imaging, microbiology, ancillary and laboratory) are listed below for reference.    Significant Diagnostic Studies: Dg Chest 2 View  12/13/2014  CLINICAL DATA:  Hemoptysis. EXAM: CHEST  2 VIEW COMPARISON:  None. FINDINGS: Normal heart size.  Normal mediastinal contour. No pneumothorax. No pleural effusion. Clear lungs, with no focal lung consolidation and no pulmonary edema. IMPRESSION: No active cardiopulmonary disease. Electronically Signed   By: Delbert Phenix M.D.   On: 12/13/2014 21:27    Microbiology: Recent Results (from the past 240 hour(s))  Rapid strep screen (not at [facility])     Status: None   Collection Time: 12/10/14  6:23 PM  Result Value Ref Range Status   Streptococcus, Group A Screen (Direct) NEGATIVE NEGATIVE Final    Comment: (NOTE) A Rapid Antigen test may result negative if the antigen level in the sample is below the detection level of this test. The FDA has  not cleared this test as a stand-alone test therefore the rapid antigen negative result has reflexed to a Group A Strep culture.   Culture, Group A Strep     Status: None   Collection Time: 12/10/14  6:23 PM  Result Value Ref Range Status   Strep A Culture Negative  Final    Comment: (NOTE) Performed At: [facility] [address] [place], [place] [id number] [name] MD WU:[id number]   Blood culture (routine x 2)     Status: None (Preliminary result)   Collection Time: 12/13/14 12:45 PM  Result Value Ref Range Status   Specimen Description BLOOD RIGHT FOREARM  Final   Special Requests BOTTLES DRAWN AEROBIC AND ANAEROBIC 10CC EACH  Final   Culture   Final    NO GROWTH < 24 HOURS Performed at [facility]    Report Status PENDING  Incomplete  Rapid strep screen     Status: None   Collection Time: 12/13/14 12:55 PM  Result Value Ref Range Status   Streptococcus, Group A Screen (Direct) NEGATIVE NEGATIVE Final    Comment: (NOTE) A Rapid Antigen test may result negative if the antigen level in the sample is below the detection level of this test. The FDA has not cleared this test as a stand-alone test therefore the rapid antigen negative result has reflexed to a Group A Strep culture.   Blood culture (routine x 2)     Status: None (Preliminary result)   Collection Time: 12/13/14 12:55 PM  Result Value Ref Range Status   Specimen Description BLOOD LEFT AC  Final   Special Requests BOTTLES DRAWN AEROBIC AND ANAEROBIC 9CC EACH  Final   Culture   Final    NO GROWTH < 24 HOURS Performed at [facility]    Report Status PENDING  Incomplete     Labs: Basic Metabolic Panel:  Recent Labs Lab 12/13/14 1245 12/14/14 0530  NA 129* 133*  K 3.0* 3.6  CL 94* 99*  CO2 23 21*  GLUCOSE 159* 118*  BUN 16 10  CREATININE 1.07 1.03  CALCIUM 8.5* 8.5*   Liver Function Tests:  Recent Labs Lab 12/13/14 1245  AST 50*  ALT 45  ALKPHOS 120  BILITOT  1.7*  PROT 8.2*  ALBUMIN 3.5   No results for input(s): LIPASE, AMYLASE in the last 168 hours. No results for input(s): AMMONIA in the last 168 hours. CBC:  Recent Labs Lab 12/13/14 1245 12/14/14 0530  WBC 5.8 7.9  NEUTROABS 2.2  --   HGB 13.6 13.8  HCT 40.0 39.9  MCV 83.9 85.1  PLT 247  246 272   Cardiac Enzymes:  Recent Labs Lab 12/13/14 1245  TROPONINI <0.03   BNP: BNP (last 3 results) No results for input(s): BNP in the last 8760  hours.  ProBNP (last 3 results) No results for input(s): PROBNP in the last 8760 hours.  CBG: No results for input(s): GLUCAP in the last 168 hours.     SignedMarlin Canary:  April Carlyon  [facility] 12/15/2014, 8:30 AM

## 2014-12-15 NOTE — Progress Notes (Signed)
Brian FordyceNicholas Ohm to be D/C'd Home per MD order.  Discussed prescriptions and follow up appointments with the patient. Prescriptions given to patient, medication list explained in detail. Pt verbalized understanding.    Medication List    STOP taking these medications        BLISTEX MEDICATED EX     diphenhydrAMINE 12.5 MG/5ML liquid  Commonly known as:  BENADRYL     trimethoprim-polymyxin b ophthalmic solution  Commonly known as:  POLYTRIM      TAKE these medications        acetaminophen 325 MG tablet  Commonly known as:  TYLENOL  Take 650 mg by mouth every 6 (six) hours as needed (pain).     ibuprofen 200 MG tablet  Commonly known as:  ADVIL,MOTRIN  Take 200 mg by mouth every 6 (six) hours as needed (pain).     magic mouthwash Soln  Take 5 mLs by mouth 3 (three) times daily as needed for mouth pain.        Filed Vitals:   12/15/14 0907  BP: 131/88  Pulse: 113  Temp: 98.3 F (36.8 C)  Resp: 20    Skin clean, dry and intact without evidence of skin break down, no evidence of skin tears noted. IV catheter discontinued intact. Site without signs and symptoms of complications. Dressing and pressure applied. Pt denies pain at this time. No complaints noted.  An After Visit Summary was printed and given to the patient. Patient escorted via WC, and D/C home via private auto.  Michio Thier A 12/15/2014 11:44 AM

## 2014-12-16 DIAGNOSIS — R509 Fever, unspecified: Secondary | ICD-10-CM | POA: Insufficient documentation

## 2014-12-16 LAB — GONOCOCCUS CULTURE
CULTURE: NO GROWTH
Special Requests: NORMAL

## 2014-12-18 LAB — CULTURE, BLOOD (ROUTINE X 2)
CULTURE: NO GROWTH
Culture: NO GROWTH

## 2014-12-26 ENCOUNTER — Encounter: Payer: Self-pay | Admitting: Family Medicine

## 2014-12-26 ENCOUNTER — Ambulatory Visit (INDEPENDENT_AMBULATORY_CARE_PROVIDER_SITE_OTHER): Payer: BC Managed Care – PPO | Admitting: Family Medicine

## 2014-12-26 VITALS — BP 134/81 | HR 100 | Temp 98.5°F | Resp 16 | Ht 72.0 in | Wt 229.0 lb

## 2014-12-26 DIAGNOSIS — E669 Obesity, unspecified: Secondary | ICD-10-CM | POA: Diagnosis not present

## 2014-12-26 DIAGNOSIS — Z23 Encounter for immunization: Secondary | ICD-10-CM | POA: Diagnosis not present

## 2014-12-26 DIAGNOSIS — Z0000 Encounter for general adult medical examination without abnormal findings: Secondary | ICD-10-CM

## 2014-12-26 DIAGNOSIS — B084 Enteroviral vesicular stomatitis with exanthem: Secondary | ICD-10-CM

## 2014-12-26 LAB — COMPLETE METABOLIC PANEL WITH GFR
ALK PHOS: 138 U/L — AB (ref 40–115)
ALT: 34 U/L (ref 9–46)
AST: 20 U/L (ref 10–40)
Albumin: 3.7 g/dL (ref 3.6–5.1)
BILIRUBIN TOTAL: 0.6 mg/dL (ref 0.2–1.2)
BUN: 8 mg/dL (ref 7–25)
CO2: 27 mmol/L (ref 20–31)
Calcium: 9.4 mg/dL (ref 8.6–10.3)
Chloride: 99 mmol/L (ref 98–110)
Creat: 0.96 mg/dL (ref 0.60–1.35)
Glucose, Bld: 105 mg/dL — ABNORMAL HIGH (ref 65–99)
Potassium: 4.1 mmol/L (ref 3.5–5.3)
Sodium: 135 mmol/L (ref 135–146)
TOTAL PROTEIN: 7.3 g/dL (ref 6.1–8.1)

## 2014-12-26 LAB — LIPID PANEL
Cholesterol: 116 mg/dL — ABNORMAL LOW (ref 125–200)
HDL: 25 mg/dL — AB (ref >=40)
LDL Cholesterol: 77 mg/dL (ref <130)
Total CHOL/HDL Ratio: 4.6 Ratio (ref <=5.0)
Triglycerides: 71 mg/dL (ref <150)
VLDL: 14 mg/dL (ref <30)

## 2014-12-26 LAB — HEMOGLOBIN A1C
Hgb A1c MFr Bld: 6.7 % — ABNORMAL HIGH (ref <5.7)
MEAN PLASMA GLUCOSE: 146 mg/dL — AB (ref <117)

## 2014-12-26 LAB — TSH: TSH: 0.987 u[IU]/mL (ref 0.350–4.500)

## 2014-12-26 MED ORDER — MAGIC MOUTHWASH: 5.0000 mL | Freq: Three times a day (TID) | ORAL | Status: DC | PRN

## 2014-12-26 NOTE — Patient Instructions (Addendum)
Recommend Dial Soap Use emmollient on skin (vaseline or oil based lotion)   Full Liquid Diet A full liquid diet may be used:   To help you transition from a clear liquid diet to a soft diet.   When your body is healing and can only tolerate foods that are easy to digest.  Before or after certain a procedure, test, or surgery (such as stomach or intestinal surgeries).   If you have trouble swallowing or chewing.  A full liquid diet includes fluids and foods that are liquid or will become liquid at room temperature. The full liquid diet gives you the proteins, fluids, salts, and minerals that you need for energy. If you continue this diet for more than 72 hours, talk to your health care provider about how many calories you need to consume. If you continue the diet for more than 5 days, talk to your health care provider about taking a multivitamin or a nutritional supplement. WHAT DO I NEED TO KNOW ABOUT A FULL LIQUID DIET?  You may have any liquid.  You may have any food that becomes a liquid at room temperature. The food is considered a liquid if it can be poured off a spoon at room temperature.  Drink one serving of citrus or vitamin C-enriched fruit juice daily. WHAT FOODS CAN I EAT? Grains Any grain food that can be pureed in soup (such as crackers, pasta, and rice). Hot cereal (such as farina or oatmeal) that has been blended. Talk to your health care provider or dietitian about these foods. Vegetables Pulp-free tomato or vegetable juice. Vegetables pureed in soup.  Fruits Fruit juice, including nectars and juices with pulp. Meats and Other Protein Sources Eggs in custard, eggnog mix, and eggs used in ice cream or pudding. Strained meats, like in baby food, may be allowed. Consult your health care provider.  Dairy Milk and milk-based beverages, including milk shakes and instant breakfast mixes. Smooth yogurt. Pureed cottage cheese. Avoid these foods if they are not well  tolerated. Beverages All beverages, including liquid nutritional supplements. Ask your health care provider if you can have carbonated beverages. They may not be well tolerated. Condiments Iodized salt, pepper, spices, and flavorings. Cocoa powder. Vinegar, ketchup, yellow mustard, smooth sauces (such as hollandaise, cheese sauce, or white sauce), and soy sauce. Sweets and Desserts Custard, smooth pudding. Flavored gelatin. Tapioca, junket. Plain ice cream, sherbet, fruit ices. Frozen ice pops, frozen fudge pops, pudding pops, and other frozen bars with cream. Syrups, including chocolate syrup. Sugar, honey, jelly.  Fats and Oils Margarine, butter, cream, sour cream, and oils. Other Broth and cream soups. Strained, broth-based soups. The items listed above may not be a complete list of recommended foods or beverages. Contact your dietitian for more options.  WHAT FOODS CAN I NOT EAT? Grains All breads. Grains are not allowed unless they are pureed into soup. Vegetables Vegetables are not allowed unless they are juiced, or cooked and pureed into soup. Fruits Fruits are not allowed unless they are juiced. Meats and Other Protein Sources Any meat or fish. Cooked or raw eggs. Nut butters.  Dairy Cheese.  Condiments Stone ground mustards. Fats and Oils Fats that are coarse or chunky. Sweets and Desserts Ice cream or other frozen desserts that have any solids in them or on top, such as nuts, chocolate chips, and pieces of cookies. Cakes. Cookies. Candy. Others Soups with chunks or pieces in them. The items listed above may not be a complete list of foods  and beverages to avoid. Contact your dietitian for more information.   This information is not intended to replace advice given to you by your health care provider. Make sure you discuss any questions you have with your health care provider.   Document Released: 01/27/2005 Document Revised: 02/01/2013 Document Reviewed:  12/02/2012 Elsevier Interactive Patient Education 2016 Else[employer] Inc[employer] Hand Washing Germs like bacteria, viruses, and parasites are found everywhere. They can be in the air and water, and they can be on surfaces like food, door handles, and your skin. Every day, your hands come into contact with germs, many of which can make you and your family sick. Washing your hands is one of the easiest and most effective ways to reduce your risk of contracting and sharing germs. WHEN SHOULD I WASH MY HANDS? You should wash your hands whenever you think they are dirty. You should also wash your hands:  Before:  Visiting a baby or anyone with a weakened or lowered defense (immune) system.  Putting in and taking out any contact lenses.  After:  Working or playing outside.  Touching an animal or its toys or leash.  Handling livestock.  Using the bathroom.  Using household cleaners or toxic chemicals.  Touching or taking out the garbage.  Touching anything dirty around your home.  Handling soiled clothes or rags.  Taking care of a sick child. This includes touching used tissues, toys, and clothes.  Sneezing, coughing, or blowing your nose.  Using public transportation.  Shaking hands.  Using a phone, including your mobile phone.  Touching money.  Before and after:  Preparing food.  Preparing a bottle for a baby.  Feeding a baby or young child.  Eating.  Visiting or taking care of someone who is sick.  Changing a diaper.  Changing a bandage (dressing) or taking care of an injury or wound.  Giving or taking medicine. WHAT IS THE CORRECT WAY TO [facility] MY HANDS?   Wet your hands with clean, running water.  Apply liquid soap or bar soap to your hands.  Rub your hands together quickly to create lather.  Keep rubbing your hands together for at least 20 seconds. Thoroughly scrub all parts of your hands, including under your fingernails and between your fingers.  Rinse your  hands with clean, running water until all the soap is gone.  Dry your hands using an air dryer or a clean paper or cloth towel, or let your hands air-dry. Do not use your clothing or a soiled towel to dry your hands.  If you are in a public restroom, use your towel:  To turn off the water faucet.  To open the bathroom door. HOW CAN I CLEAN MY HANDS IF I DO NOT HAVE SOAP AND WATER? If soap and clean water are not available, use an alcohol-based wipe, spray, or hand gel. Use a hand-sanitizing agent that contains at least 60% alcohol. If you are preparing food, hand sanitizers are not recommended as a substitute for hand washing. To use a hand sanitizer, follow the directions provided on the product, and:  Apply an adequate amount of the product to your hands.  Make sure you wipe, rub, or spray the product so that it reaches every part of your hands and wrists. Include the backs of your hands, between your fingers, and under your fingernails.  Rub the product onto your hands until it dries.   This information is not intended to replace advice given to you by your health  care provider. Make sure you discuss any questions you have with your health care provider.   Document Released: 09/17/2004 Document Revised: 02/17/2014 Document Reviewed: 06/24/2013 Elsevier Interactive Patient Education [employer]2016 Elsevier Inc.

## 2014-12-26 NOTE — Progress Notes (Signed)
Subjective:    Patient ID: Brian Medina, male    DOB: [birth year], 34 y.o.   MRN: [id number]  HPI  Brian Medina, a 34 year old male presents to establish care. Patient reports that he has not had a primary provider as an adult. Patient was recently hospitalized for hand, foot, mouth syndrome. He states that symptoms started with a sore throat and progressed to a fever of 103, with red eyes, mouth sores, sores appearing on hand, legs, and feet.  Patient reports that symptoms have improved since hospital discharge on 12/15/2014. He continues to have red eyes, mouth sores, and scabs to hands and feet. He denies fever, urinary issues, nausea, vomiting, or diarrhea.  History reviewed. No pertinent past medical history.  Social History   Social History  . Marital Status: Single    Spouse Name: N/A  . Number of Children: N/A  . Years of Education: N/A   Occupational History  . Not on file.   Social History Main Topics  . Smoking status: Never Smoker   . Smokeless tobacco: Never Used  . Alcohol Use: Yes     Comment: occ  . Drug Use: No  . Sexual Activity: Yes    Birth Control/ Protection: Condom   Other Topics Concern  . Not on file   Social History Narrative   Immunization History  Administered Date(s) Administered  . Influenza,inj,Quad PF,36+ Mos 12/26/2014  . Tdap 12/26/2014   Allergies  Allergen Reactions  . Other Anaphylaxis    Mushrooms  . Mushroom Extract Complex    Review of Systems  Constitutional: Positive for fatigue.  HENT: Positive for mouth sores.   Eyes: Positive for redness.  Respiratory: Negative.  Negative for shortness of breath.   Cardiovascular: Negative.  Negative for chest pain, palpitations and leg swelling.  Gastrointestinal: Negative.  Negative for constipation and blood in stool.  Endocrine: Negative for polydipsia, polyphagia and polyuria.  Musculoskeletal: Negative.   Skin: Negative.   Allergic/Immunologic: Positive for  environmental allergies. Negative for food allergies.  Neurological: Negative.  Negative for dizziness, tremors, weakness, light-headedness and headaches.  Hematological: Negative.   Psychiatric/Behavioral: Negative for suicidal ideas and sleep disturbance.       Objective:   Physical Exam  Constitutional: He has a sickly appearance.  HENT:  Head: Normocephalic and atraumatic.  Right Ear: External ear normal.  Left Ear: External ear normal.  Mouth/Throat: Oropharynx is clear and moist.  Eyes: Right eye exhibits no discharge and no exudate. Left eye exhibits no discharge and no exudate. Right conjunctiva has a hemorrhage. Left conjunctiva has a hemorrhage.  Neck: Trachea normal, normal range of motion and full passive range of motion without pain. Neck supple.  Cardiovascular: Normal rate, regular rhythm, S1 normal, S2 normal and normal heart sounds.   Pulmonary/Chest: Effort normal and breath sounds normal. He has no decreased breath sounds.  Abdominal: Soft. Normal appearance and normal aorta.  Lymphadenopathy:       Head (right side): No submental, no submandibular, no tonsillar, no preauricular and no posterior auricular adenopathy present.       Head (left side): No submental, no submandibular, no tonsillar, no preauricular and no posterior auricular adenopathy present.  Neurological: He has normal strength. No cranial nerve deficit or sensory deficit. He displays a negative Romberg sign.  Skin: Skin is warm, dry and intact.  Psychiatric: He has a normal mood and affect. His speech is normal and behavior is normal. Judgment and thought content normal. Cognition  and memory are normal.      BP 134/81 mmHg  Pulse 100  Temp(Src) 98.5 F (36.9 C) (Oral)  Resp 16  Ht 6' (1.829 m)  Wt 229 lb (103.874 kg)  BMI 31.05 kg/m2 Assessment & Plan:  1. Hand, foot and mouth disease Symptoms have improved since hospital discharge. Patient positive for mouth sores and eye redness. Continue  bland soft diet and magic mouth wash as prescribed. Recommend increased rest and handwashing.  - magic mouthwash SOLN; Take 5 mLs by mouth 3 (three) times daily as needed for mouth pain.  Dispense: 15 mL; Refill: 0  2. Obesity Current BMI is 30, will screen for metabolic syndrome. Recommend a lowfat, low carbohydrate diet divided over 5-6 small meals, increase water intake to 6-8 glasses, and 150 minutes per week of cardiovascular exercise.   - COMPLETE METABOLIC PANEL WITH GFR - Hemoglobin A1c - TSH - Lipid Panel  3. Need for Tdap vaccination - Tdap vaccine greater than or equal to 7yo IM  4. Need for prophylactic vaccination and inoculation against influenza  - Flu Vaccine QUAD 36+ mos IM (Fluarix & Fluzone Quad PF  5. Routine health maintenance Recommend monthly self testicular exam. Will return in 6 months for a digital prostate and testicular exam.  Recommend that patient continue using barrier protection with sexual intercourse.  Recommend yearly eye exam and dental visit.     RTC: 6 months  The patient was given clear instructions to go to ER or return to medical center if symptoms do not improve, worsen or new problems develop. The patient verbalized understanding. Will notify patient with laboratory results.    Massie MaroonHollis,Landrie Beale M, FNP

## 2014-12-27 ENCOUNTER — Telehealth: Payer: Self-pay | Admitting: Family Medicine

## 2014-12-27 DIAGNOSIS — E119 Type 2 diabetes mellitus without complications: Secondary | ICD-10-CM

## 2014-12-27 MED ORDER — METFORMIN HCL 500 MG PO TABS
500.0000 mg | ORAL_TABLET | Freq: Every day | ORAL | Status: DC
Start: 2014-12-27 — End: 2015-01-26

## 2014-12-27 MED ORDER — METFORMIN HCL 500 MG PO TABS: 500.0000 mg | ORAL_TABLET | Freq: Two times a day (BID) | ORAL | Status: DC

## 2014-12-27 NOTE — Telephone Encounter (Signed)
Called and left message for patient to return call regarding labs. Thanks!  

## 2014-12-27 NOTE — Telephone Encounter (Signed)
Reviewed labs, hemoglobin a1C is 6.7, which is indicative of type 2 diabetes mellitus. Will start Metformin 500 mg daily. Recommend a lowfat, low carbohydrate diet divided over 5-6 small meals, increase water intake to 6-8 glasses, and 150 minutes per week of cardiovascular exercise.    Follow up in office in 1 month.    Massie MaroonHollis,Shritha Bresee M, FNP

## 2014-12-29 NOTE — Telephone Encounter (Signed)
Spoke with patient and advised of labs and to start metformin daily as prescribed. Advised patient to follow  Low/carb, low fat diet, increase water to 6 to 8 glasses daily and to exercise 150 minutes weekly. Thanks!

## 2015-01-26 ENCOUNTER — Encounter: Payer: Self-pay | Admitting: Family Medicine

## 2015-01-26 ENCOUNTER — Ambulatory Visit (INDEPENDENT_AMBULATORY_CARE_PROVIDER_SITE_OTHER): Payer: BC Managed Care – PPO | Admitting: Family Medicine

## 2015-01-26 VITALS — BP 129/74 | HR 86 | Temp 98.0°F | Resp 16 | Ht 72.0 in | Wt 220.0 lb

## 2015-01-26 DIAGNOSIS — E119 Type 2 diabetes mellitus without complications: Secondary | ICD-10-CM

## 2015-01-26 LAB — POCT URINALYSIS DIP (DEVICE)
BILIRUBIN URINE: NEGATIVE
Glucose, UA: NEGATIVE mg/dL
HGB URINE DIPSTICK: NEGATIVE
KETONES UR: 40 mg/dL — AB
LEUKOCYTES UA: NEGATIVE
NITRITE: NEGATIVE
PH: 5.5 (ref 5.0–8.0)
Protein, ur: NEGATIVE mg/dL
SPECIFIC GRAVITY, URINE: 1.025 (ref 1.005–1.030)
Urobilinogen, UA: 0.2 mg/dL (ref 0.0–1.0)

## 2015-01-26 LAB — GLUCOSE, CAPILLARY: Glucose-Capillary: 78 mg/dL (ref 65–99)

## 2015-01-26 MED ORDER — METFORMIN HCL 500 MG PO TABS: 500.0000 mg | ORAL_TABLET | Freq: Every day | ORAL | Status: DC

## 2015-01-26 NOTE — Progress Notes (Signed)
Subjective:    Patient ID: Brian Medina, male    DOB: 11-17-1980, 34 y.o.   MRN: [id number]  Diabetes He presents for his follow-up (Patient was diagnosed with type II diabetes 1 month ago. ) diabetic visit. He has type 2 diabetes mellitus. The initial diagnosis of diabetes was made 1 month ago. His disease course has been stable. Pertinent negatives for hypoglycemia include no confusion, dizziness, headaches, hunger, mood changes, pallor, seizures, sleepiness, speech difficulty, sweats or tremors. Pertinent negatives for diabetes include no blurred vision, no chest pain, no fatigue, no foot paresthesias, no foot ulcerations, no polydipsia, no polyphagia, no polyuria, no visual change, no weakness and no weight loss. There are no hypoglycemic complications. Symptoms are stable. There are no diabetic complications. Risk factors for coronary artery disease include sedentary lifestyle. Current diabetic treatment includes diet and oral agent (monotherapy). He is compliant with treatment all of the time. He is following a low fat/cholesterol diet. He has not had a previous visit with a dietitian. He never participates in exercise. An ACE inhibitor/angiotensin II receptor blocker is not being taken. He does not see a podiatrist.Eye exam is not current.    Past Medical History  Diagnosis Date  . Diabetes mellitus [facility])     Social History   Social History  . Marital Status: Single    Spouse Name: N/A  . Number of Children: N/A  . Years of Education: N/A   Occupational History  . Not on file.   Social History Main Topics  . Smoking status: Never Smoker   . Smokeless tobacco: Never Used  . Alcohol Use: Yes     Comment: occ  . Drug Use: No  . Sexual Activity: Yes    Birth Control/ Protection: Condom   Other Topics Concern  . Not on file   Social History Narrative   Immunization History  Administered Date(s) Administered  . Influenza,inj,Quad PF,36+ Mos 12/26/2014  . Tdap 12/26/2014    Allergies  Allergen Reactions  . Other Anaphylaxis    Mushrooms  . Mushroom Extract Complex     Review of Systems  Constitutional: Negative.  Negative for weight loss and fatigue.  HENT: Negative.   Eyes: Negative.  Negative for blurred vision.  Respiratory: Negative.   Cardiovascular: Negative.  Negative for chest pain, palpitations and leg swelling.  Gastrointestinal: Negative.  Negative for nausea.  Endocrine: Negative.  Negative for polydipsia, polyphagia and polyuria.  Musculoskeletal: Negative.   Skin: Negative.  Negative for pallor.  Allergic/Immunologic: Negative.   Neurological: Negative for dizziness, tremors, seizures, speech difficulty, weakness and headaches.  Hematological: Negative.   Psychiatric/Behavioral: Negative.  Negative for suicidal ideas, confusion and sleep disturbance.       Objective:   Physical Exam  Constitutional: He is oriented to person, place, and time. He appears well-developed and well-nourished.  HENT:  Head: Normocephalic and atraumatic.  Right Ear: External ear normal.  Left Ear: External ear normal.  Mouth/Throat: Oropharynx is clear and moist.  Eyes: EOM are normal. Pupils are equal, round, and reactive to light.  Red sclera bilaterally  Neck: Normal range of motion. Neck supple.  Cardiovascular: Normal rate, regular rhythm, normal heart sounds and intact distal pulses.   Pulmonary/Chest: Effort normal and breath sounds normal.  Abdominal: Soft. Bowel sounds are normal.  Musculoskeletal: Normal range of motion.  Neurological: He is alert and oriented to person, place, and time. He has normal strength and normal reflexes. No cranial nerve deficit or sensory deficit. He displays  a negative Romberg sign.  Monofilament test negative  Skin: Skin is warm and dry.  Psychiatric: He has a normal mood and affect. His behavior is normal. Judgment and thought content normal.       BP 129/74 mmHg  Pulse 86  Temp(Src) 98 F (36.7 C)  (Oral)  Resp 16  Ht 6' (1.829 m)  Wt 220 lb (99.791 kg)  BMI 29.83 kg/m2 Assessment & Plan:  1. Type 2 diabetes mellitus without complication, without long-term current use of insulin (HCC) Reviewed CBG, 74, patient has not eaten since earlier this am. Recommend a carbohydrate modified diet divided over 5-6 small meals throughout the day. Discussed diet and exercise regimen at length. Will re-check hemoglobin A1C in 3 months. Reviewed urinalysis, no proteinuria or glucosuria present. Will continue Metformin at current dosage.  - Glucose (CBG) - POCT urinalysis dipstick - metFORMIN (GLUCOPHAGE) 500 MG tablet; Take 1 tablet (500 mg total) by mouth daily with breakfast.  Dispense: 30 tablet; Refill: 2   RTC: 3 months for a follow-up of type 2 diabetes mellitus   Hollis,Lachina M, FNP

## 2015-01-26 NOTE — Patient Instructions (Signed)
Hyperglycemia Hyperglycemia occurs when the glucose (sugar) in your blood is too high. Hyperglycemia can happen for many reasons, but it most often happens to people who do not know they have diabetes or are not managing their diabetes properly.  CAUSES  Whether you have diabetes or not, there are other causes of hyperglycemia. Hyperglycemia can occur when you have diabetes, but it can also occur in other situations that you might not be as aware of, such as: Diabetes  If you have diabetes and are having problems controlling your blood glucose, hyperglycemia could occur because of some of the following reasons:  Not following your meal plan.  Not taking your diabetes medications or not taking it properly.  Exercising less or doing less activity than you normally do.  Being sick. Pre-diabetes  This cannot be ignored. Before people develop Type 2 diabetes, they almost always have "pre-diabetes." This is when your blood glucose levels are higher than normal, but not yet high enough to be diagnosed as diabetes. Research has shown that some long-term damage to the body, especially the heart and circulatory system, may already be occurring during pre-diabetes. If you take action to manage your blood glucose when you have pre-diabetes, you may delay or prevent Type 2 diabetes from developing. Stress  If you have diabetes, you may be "diet" controlled or on oral medications or insulin to control your diabetes. However, you may find that your blood glucose is higher than usual in the hospital whether you have diabetes or not. This is often referred to as "stress hyperglycemia." Stress can elevate your blood glucose. This happens because of hormones put out by the body during times of stress. If stress has been the cause of your high blood glucose, it can be followed regularly by your caregiver. That way he/she can make sure your hyperglycemia does not continue to get worse or progress to  diabetes. Steroids  Steroids are medications that act on the infection fighting system (immune system) to block inflammation or infection. One side effect can be a rise in blood glucose. Most people can produce enough extra insulin to allow for this rise, but for those who cannot, steroids make blood glucose levels go even higher. It is not unusual for steroid treatments to "uncover" diabetes that is developing. It is not always possible to determine if the hyperglycemia will go away after the steroids are stopped. A special blood test called an A1c is sometimes done to determine if your blood glucose was elevated before the steroids were started. SYMPTOMS  Thirsty.  Frequent urination.  Dry mouth.  Blurred vision.  Tired or fatigue.  Weakness.  Sleepy.  Tingling in feet or leg. DIAGNOSIS  Diagnosis is made by monitoring blood glucose in one or all of the following ways:  A1c test. This is a chemical found in your blood.  Fingerstick blood glucose monitoring.  Laboratory results. TREATMENT  First, knowing the cause of the hyperglycemia is important before the hyperglycemia can be treated. Treatment may include, but is not be limited to:  Education.  Change or adjustment in medications.  Change or adjustment in meal plan.  Treatment for an illness, infection, etc.  More frequent blood glucose monitoring.  Change in exercise plan.  Decreasing or stopping steroids.  Lifestyle changes. HOME CARE INSTRUCTIONS   Test your blood glucose as directed.  Exercise regularly. Your caregiver will give you instructions about exercise. Pre-diabetes or diabetes which comes on with stress is helped by exercising.  Eat wholesome,   balanced meals. Eat often and at regular, fixed times. Your caregiver or nutritionist will give you a meal plan to guide your sugar intake.  Being at an ideal weight is important. If needed, losing as little as 10 to 15 pounds may help improve blood  glucose levels. SEEK MEDICAL CARE IF:   You have questions about medicine, activity, or diet.  You continue to have symptoms (problems such as increased thirst, urination, or weight gain). SEEK IMMEDIATE MEDICAL CARE IF:   You are vomiting or have diarrhea.  Your breath smells fruity.  You are breathing faster or slower.  You are very sleepy or incoherent.  You have numbness, tingling, or pain in your feet or hands.  You have chest pain.  Your symptoms get worse even though you have been following your caregiver's orders.  If you have any other questions or concerns.   This information is not intended to replace advice given to you by your health care provider. Make sure you discuss any questions you have with your health care provider.   Document Released: 07/23/2000 Document Revised: 04/21/2011 Document Reviewed: 10/03/2014 Elsevier Interactive Patient Education 2016 [employer] Type 2 Diabetes Mellitus, Adult Type 2 diabetes mellitus, often simply referred to as type 2 diabetes, is a long-lasting (chronic) disease. In type 2 diabetes, the pancreas does not make enough insulin (a hormone), the cells are less responsive to the insulin that is made (insulin resistance), or both. Normally, insulin moves sugars from food into the tissue cells. The tissue cells use the sugars for energy. The lack of insulin or the lack of normal response to insulin causes excess sugars to build up in the blood instead of going into the tissue cells. As a result, high blood sugar (hyperglycemia) develops. The effect of high sugar (glucose) levels can cause many complications. Type 2 diabetes was also previously called adult-onset diabetes, but it can occur at any age.  RISK FACTORS  A person is predisposed to developing type 2 diabetes if someone in the family has the disease and also has one or more of the following primary risk factors:  Weight gain, or being overweight or obese.  An inactive  lifestyle.  A history of consistently eating high-calorie foods. Maintaining a normal weight and regular physical activity can reduce the chance of developing type 2 diabetes. SYMPTOMS  A person with type 2 diabetes may not show symptoms initially. The symptoms of type 2 diabetes appear slowly. The symptoms include:  Increased thirst (polydipsia).  Increased urination (polyuria).  Increased urination during the night (nocturia).  Sudden or unexplained weight changes.  Frequent, recurring infections.  Tiredness (fatigue).  Weakness.  Vision changes, such as blurred vision.  Fruity smell to your breath.  Abdominal pain.  Nausea or vomiting.  Cuts or bruises which are slow to heal.  Tingling or numbness in the hands or feet.  An open skin wound (ulcer). DIAGNOSIS Type 2 diabetes is frequently not diagnosed until complications of diabetes are present. Type 2 diabetes is diagnosed when symptoms or complications are present and when blood glucose levels are increased. Your blood glucose level may be checked by one or more of the following blood tests:  A fasting blood glucose test. You will not be allowed to eat for at least 8 hours before a blood sample is taken.  A random blood glucose test. Your blood glucose is checked at any time of the day regardless of when you ate.  A hemoglobin A1c blood glucose test.  A hemoglobin A1c test provides information about blood glucose control over the previous 3 months.  An oral glucose tolerance test (OGTT). Your blood glucose is measured after you have not eaten (fasted) for 2 hours and then after you drink a glucose-containing beverage. TREATMENT   You may need to take insulin or diabetes medicine daily to keep blood glucose levels in the desired range.  If you use insulin, you may need to adjust the dosage depending on the carbohydrates that you eat with each meal or snack.  Lifestyle changes are recommended as part of your  treatment. These may include:  Following an individualized diet plan developed by a nutritionist or dietitian.  Exercising daily. Your health care providers will set individualized treatment goals for you based on your age, your medicines, how long you have had diabetes, and any other medical conditions you have. Generally, the goal of treatment is to maintain the following blood glucose levels:  Before meals (preprandial): 80-130 mg/dL.  After meals (postprandial): below 180 mg/dL.  A1c: less than 6.5-7%. HOME CARE INSTRUCTIONS   Have your hemoglobin A1c level checked twice a year.  Perform daily blood glucose monitoring as directed by your health care provider.  Monitor urine ketones when you are ill and as directed by your health care provider.  Take your diabetes medicine or insulin as directed by your health care provider to maintain your blood glucose levels in the desired range.  Never run out of diabetes medicine or insulin. It is needed every day.  If you are using insulin, you may need to adjust the amount of insulin given based on your intake of carbohydrates. Carbohydrates can raise blood glucose levels but need to be included in your diet. Carbohydrates provide vitamins, minerals, and fiber which are an essential part of a healthy diet. Carbohydrates are found in fruits, vegetables, whole grains, dairy products, legumes, and foods containing added sugars.  Eat healthy foods. You should make an appointment to see a registered dietitian to help you create an eating plan that is right for you.  Lose weight if you are overweight.  Carry a medical alert card or wear your medical alert jewelry.  Carry a 15-gram carbohydrate snack with you at all times to treat low blood glucose (hypoglycemia). Some examples of 15-gram carbohydrate snacks include:  Glucose tablets, 3 or 4.  Glucose gel, 15-gram tube.  Raisins, 2 tablespoons (24 grams).  Jelly beans, 6.  Animal  crackers, 8.  Regular pop, 4 ounces (120 mL).  Gummy treats, 9.  Recognize hypoglycemia. Hypoglycemia occurs with blood glucose levels of 70 mg/dL and below. The risk for hypoglycemia increases when fasting or skipping meals, during or after intense exercise, and during sleep. Hypoglycemia symptoms can include:  Tremors or shakes.  Decreased ability to concentrate.  Sweating.  Increased heart rate.  Headache.  Dry mouth.  Hunger.  Irritability.  Anxiety.  Restless sleep.  Altered speech or coordination.  Confusion.  Treat hypoglycemia promptly. If you are alert and able to safely swallow, follow the 15:15 rule:  Take 15-20 grams of rapid-acting glucose or carbohydrate. Rapid-acting options include glucose gel, glucose tablets, or 4 ounces (120 mL) of fruit juice, regular soda, or low-fat milk.  Check your blood glucose level 15 minutes after taking the glucose.  Take 15-20 grams more of glucose if the repeat blood glucose level is still 70 mg/dL or below.  Eat a meal or snack within 1 hour once blood glucose levels return to normal.  Be alert to feeling very thirsty and urinating more frequently than usual, which are early signs of hyperglycemia. An early awareness of hyperglycemia allows for prompt treatment. Treat hyperglycemia as directed by your health care provider.  Engage in at least 150 minutes of moderate-intensity physical activity a week, spread over at least 3 days of the week or as directed by your health care provider. In addition, you should engage in resistance exercise at least 2 times a week or as directed by your health care provider. Try to spend no more than 90 minutes at one time inactive.  Adjust your medicine and food intake as needed if you start a new exercise or sport.  Follow your sick-day plan anytime you are unable to eat or drink as usual.  Do not use any tobacco products including cigarettes, chewing tobacco, or electronic cigarettes.  If you need help quitting, ask your health care provider.  Limit alcohol intake to no more than 1 drink per day for nonpregnant women and 2 drinks per day for men. You should drink alcohol only when you are also eating food. Talk with your health care provider whether alcohol is safe for you. Tell your health care provider if you drink alcohol several times a week.  Keep all follow-up visits as directed by your health care provider. This is important.  Schedule an eye exam soon after the diagnosis of type 2 diabetes and then annually.  Perform daily skin and foot care. Examine your skin and feet daily for cuts, bruises, redness, nail problems, bleeding, blisters, or sores. A foot exam by a health care provider should be done annually.  Brush your teeth and gums at least twice a day and floss at least once a day. Follow up with your dentist regularly.  Share your diabetes management plan with your workplace or school.  Keep your immunizations up to date. It is recommended that you receive a flu (influenza) vaccine every year. It is also recommended that you receive a pneumonia (pneumococcal) vaccine. If you are 39 years of age or older and have never received a pneumonia vaccine, this vaccine may be given as a series of two separate shots. Ask your health care provider which additional vaccines may be recommended.  Learn to manage stress.  Obtain ongoing diabetes education and support as needed.  Participate in or seek rehabilitation as needed to maintain or improve independence and quality of life. Request a physical or occupational therapy referral if you are having foot or hand numbness, or difficulties with grooming, dressing, eating, or physical activity. SEEK MEDICAL CARE IF:   You are unable to eat food or drink fluids for more than 6 hours.  You have nausea and vomiting for more than 6 hours.  Your blood glucose level is over 240 mg/dL.  There is a change in mental status.  You  develop an additional serious illness.  You have diarrhea for more than 6 hours.  You have been sick or have had a fever for a couple of days and are not getting better.  You have pain during any physical activity.  SEEK IMMEDIATE MEDICAL CARE IF:  You have difficulty breathing.  You have moderate to large ketone levels.   This information is not intended to replace advice given to you by your health care provider. Make sure you discuss any questions you have with your health care provider.   Document Released: 01/27/2005 Document Revised: 10/18/2014 Document Reviewed: 08/26/2011 Elsevier Interactive Patient Education 2016 Elsevier  Inc. Basic Carbohydrate Counting for Diabetes Mellitus Carbohydrate counting is a method for keeping track of the amount of carbohydrates you eat. Eating carbohydrates naturally increases the level of sugar (glucose) in your blood, so it is important for you to know the amount that is okay for you to have in every meal. Carbohydrate counting helps keep the level of glucose in your blood within normal limits. The amount of carbohydrates allowed is different for every person. A dietitian can help you calculate the amount that is right for you. Once you know the amount of carbohydrates you can have, you can count the carbohydrates in the foods you want to eat. Carbohydrates are found in the following foods:  Grains, such as breads and cereals.  Dried beans and soy products.  Starchy vegetables, such as potatoes, peas, and corn.  Fruit and fruit juices.  Milk and yogurt.  Sweets and snack foods, such as cake, cookies, candy, chips, soft drinks, and fruit drinks. CARBOHYDRATE COUNTING There are two ways to count the carbohydrates in your food. You can use either of the methods or a combination of both. Reading the "Nutrition Facts" on Packaged Food The "Nutrition Facts" is an area that is included on the labels of almost all packaged food and beverages in  the [place]. It includes the serving size of that food or beverage and information about the nutrients in each serving of the food, including the grams (g) of carbohydrate per serving.  Decide the number of servings of this food or beverage that you will be able to eat or drink. Multiply that number of servings by the number of grams of carbohydrate that is listed on the label for that serving. The total will be the amount of carbohydrates you will be having when you eat or drink this food or beverage. Learning Standard Serving Sizes of Food When you eat food that is not packaged or does not include "Nutrition Facts" on the label, you need to measure the servings in order to count the amount of carbohydrates.A serving of most carbohydrate-rich foods contains about 15 g of carbohydrates. The following list includes serving sizes of carbohydrate-rich foods that provide 15 g ofcarbohydrate per serving:   1 slice of bread (1 oz) or 1 six-inch tortilla.    of a hamburger bun or English muffin.  4-6 crackers.   cup unsweetened dry cereal.    cup hot cereal.   cup rice or pasta.    cup mashed potatoes or  of a large baked potato.  1 cup fresh fruit or one small piece of fruit.    cup canned or frozen fruit or fruit juice.  1 cup milk.   cup plain fat-free yogurt or yogurt sweetened with artificial sweeteners.   cup cooked dried beans or starchy vegetable, such as peas, corn, or potatoes.  Decide the number of standard-size servings that you will eat. Multiply that number of servings by 15 (the grams of carbohydrates in that serving). For example, if you eat 2 cups of strawberries, you will have eaten 2 servings and 30 g of carbohydrates (2 servings x 15 g = 30 g). For foods such as soups and casseroles, in which more than one food is mixed in, you will need to count the carbohydrates in each food that is included. EXAMPLE OF CARBOHYDRATE COUNTING Sample Dinner  3 oz  chicken breast.   cup of brown rice.   cup of corn.  1 cup milk.   1 cup  strawberries with sugar-free whipped topping.  Carbohydrate Calculation Step 1: Identify the foods that contain carbohydrates:   Rice.   Corn.   Milk.   Strawberries. Step 2:Calculate the number of servings eaten of each:   2 servings of rice.   1 serving of corn.   1 serving of milk.   1 serving of strawberries. Step 3: Multiply each of those number of servings by 15 g:   2 servings of rice x 15 g = 30 g.   1 serving of corn x 15 g = 15 g.   1 serving of milk x 15 g = 15 g.   1 serving of strawberries x 15 g = 15 g. Step 4: Add together all of the amounts to find the total grams of carbohydrates eaten: 30 g + 15 g + 15 g + 15 g = 75 g.   This information is not intended to replace advice given to you by your health care provider. Make sure you discuss any questions you have with your health care provider.   Document Released: 01/27/2005 Document Revised: 02/17/2014 Document Reviewed: 12/24/2012 Elsevier Interactive Patient Education [employer].

## 2015-01-27 ENCOUNTER — Encounter: Payer: Self-pay | Admitting: Family Medicine

## 2015-02-20 MED FILL — ?METFORMIN HCL 500MG TABLET: 500 | 30 days supply | Qty: 30 | Fill #0

## 2015-03-15 MED FILL — ?METFORMIN HCL 500MG TABLET: 500 | 30 days supply | Qty: 30 | Fill #1

## 2015-04-27 ENCOUNTER — Ambulatory Visit (INDEPENDENT_AMBULATORY_CARE_PROVIDER_SITE_OTHER): Payer: Self-pay | Admitting: Family Medicine

## 2015-04-27 ENCOUNTER — Encounter: Payer: Self-pay | Admitting: Family Medicine

## 2015-04-27 VITALS — BP 122/77 | HR 90 | Temp 97.7°F | Resp 16 | Ht 72.0 in | Wt 221.0 lb

## 2015-04-27 DIAGNOSIS — E119 Type 2 diabetes mellitus without complications: Secondary | ICD-10-CM

## 2015-04-27 LAB — POCT URINALYSIS DIP (DEVICE)
Bilirubin Urine: NEGATIVE
GLUCOSE, UA: NEGATIVE mg/dL
Hgb urine dipstick: NEGATIVE
Ketones, ur: NEGATIVE mg/dL
Leukocytes, UA: NEGATIVE
Nitrite: NEGATIVE
PH: 6.5 (ref 5.0–8.0)
PROTEIN: NEGATIVE mg/dL
Specific Gravity, Urine: 1.02 (ref 1.005–1.030)
UROBILINOGEN UA: 0.2 mg/dL (ref 0.0–1.0)

## 2015-04-27 LAB — HEMOGLOBIN A1C
HEMOGLOBIN A1C: 6 % — AB (ref <5.7)
MEAN PLASMA GLUCOSE: 126 mg/dL — AB (ref <117)

## 2015-04-27 MED ORDER — METFORMIN HCL 500 MG PO TABS: 500.0000 mg | ORAL_TABLET | Freq: Every day | ORAL | Status: DC

## 2015-04-27 MED FILL — metFORMIN HCL 500 MG TABS: 500 | 30 days supply | Qty: 30 | Fill #0 | Status: TO

## 2015-04-27 NOTE — Progress Notes (Signed)
Subjective:    Patient ID: Brian Medina, male    DOB: [birth year], 35 y.o.   MRN: [id number]  Diabetes He presents for his follow-up (Patient was diagnosed with type II diabetes 1 month ago. ) diabetic visit. He has type 2 diabetes mellitus. The initial diagnosis of diabetes was made 3 months ago. His disease course has been stable. Pertinent negatives for hypoglycemia include no confusion, dizziness, headaches, hunger, mood changes, pallor, seizures, sleepiness, speech difficulty, sweats or tremors. Pertinent negatives for diabetes include no blurred vision, no chest pain, no fatigue, no foot paresthesias, no foot ulcerations, no polydipsia, no polyphagia, no polyuria, no visual change, no weakness and no weight loss. There are no hypoglycemic complications. Symptoms are stable. There are no diabetic complications. Risk factors for coronary artery disease include sedentary lifestyle. Current diabetic treatment includes diet and oral agent (monotherapy). He is compliant with treatment all of the time. He is following a low fat/cholesterol diet. He has not had a previous visit with a dietitian. He never participates in exercise. An ACE inhibitor/angiotensin II receptor blocker is not being taken. He does not see a podiatrist.Eye exam is not current.    Past Medical History  Diagnosis Date  . Diabetes mellitus [facility](HCC)     Social History   Social History  . Marital Status: Single    Spouse Name: N/A  . Number of Children: N/A  . Years of Education: N/A   Occupational History  . Not on file.   Social History Main Topics  . Smoking status: Never Smoker   . Smokeless tobacco: Never Used  . Alcohol Use: Yes     Comment: occ  . Drug Use: No  . Sexual Activity: Yes    Birth Control/ Protection: Condom   Other Topics Concern  . Not on file   Social History Narrative   Immunization History  Administered Date(s) Administered  . Influenza,inj,Quad PF,36+ Mos 12/26/2014  . Tdap  12/26/2014   Allergies  Allergen Reactions  . Other Anaphylaxis    Mushrooms  . Mushroom Extract Complex     Review of Systems  Constitutional: Negative.  Negative for weight loss and fatigue.  HENT: Negative.   Eyes: Negative.  Negative for blurred vision.  Respiratory: Negative.   Cardiovascular: Negative.  Negative for chest pain, palpitations and leg swelling.  Gastrointestinal: Negative.  Negative for nausea.  Endocrine: Negative.  Negative for polydipsia, polyphagia and polyuria.  Musculoskeletal: Negative.   Skin: Negative.  Negative for pallor.  Allergic/Immunologic: Negative.   Neurological: Negative for dizziness, tremors, seizures, speech difficulty, weakness and headaches.  Hematological: Negative.   Psychiatric/Behavioral: Negative.  Negative for suicidal ideas, confusion and sleep disturbance.       Objective:   Physical Exam  Constitutional: He is oriented to person, place, and time. He appears well-developed and well-nourished.  HENT:  Head: Normocephalic and atraumatic.  Right Ear: External ear normal.  Left Ear: External ear normal.  Mouth/Throat: Oropharynx is clear and moist.  Eyes: EOM are normal. Pupils are equal, round, and reactive to light.  Neck: Normal range of motion. Neck supple.  Cardiovascular: Normal rate, regular rhythm, normal heart sounds and intact distal pulses.   Pulmonary/Chest: Effort normal and breath sounds normal.  Abdominal: Soft. Bowel sounds are normal.  Musculoskeletal: Normal range of motion.  Neurological: He is alert and oriented to person, place, and time. He has normal strength and normal reflexes. No cranial nerve deficit or sensory deficit. He displays a negative Romberg sign.  Monofilament test negative  Skin: Skin is warm and dry.  Psychiatric: He has a normal mood and affect. His behavior is normal. Judgment and thought content normal.       BP 122/77 mmHg  Pulse 90  Temp(Src) 97.7 F (36.5 C) (Oral)  Resp 16   Ht 6' (1.829 m)  Wt 221 lb (100.245 kg)  BMI 29.97 kg/m2 Assessment & Plan:  1. Type 2 diabetes mellitus without complication, without long-term current use of insulin (HCC) Reviewed CBG, 74, patient has not eaten since earlier this am. Recommend a carbohydrate modified diet divided over 5-6 small meals throughout the day. Discussed diet and exercise regimen at length. Will re-check hemoglobin A1C in 3 months. Reviewed urinalysis, no proteinuria or glucosuria present. Will continue Metformin at current dosage.  - Glucose (CBG) - POCT urinalysis dipstick - metFORMIN (GLUCOPHAGE) 500 MG tablet; Take 1 tablet (500 mg total) by mouth daily with breakfast.  Dispense: 30 tablet; Refill: 2   RTC: 6 months for a follow-up of type 2 diabetes mellitus   Robie Oats M, FNP

## 2015-04-27 NOTE — Patient Instructions (Signed)
Diabetes and Exercise Exercising regularly is important. It is not just about losing weight. It has many health benefits, such as:  Improving your overall fitness, flexibility, and endurance.  Increasing your bone density.  Helping with weight control.  Decreasing your body fat.  Increasing your muscle strength.  Reducing stress and tension.  Improving your overall health. People with diabetes who exercise gain additional benefits because exercise:  Reduces appetite.  Improves the body's use of blood sugar (glucose).  Helps lower or control blood glucose.  Decreases blood pressure.  Helps control blood lipids (such as cholesterol and triglycerides).  Improves the body's use of the hormone insulin by:  Increasing the body's insulin sensitivity.  Reducing the body's insulin needs.  Decreases the risk for heart disease because exercising:  Lowers cholesterol and triglycerides levels.  Increases the levels of good cholesterol (such as high-density lipoproteins [HDL]) in the body.  Lowers blood glucose levels. YOUR ACTIVITY PLAN  Choose an activity that you enjoy, and set realistic goals. To exercise safely, you should begin practicing any new physical activity slowly, and gradually increase the intensity of the exercise over time. Your health care provider or diabetes educator can help create an activity plan that works for you. General recommendations include:  Encouraging children to engage in at least 60 minutes of physical activity each day.  Stretching and performing strength training exercises, such as yoga or weight lifting, at least 2 times per week.  Performing a total of at least 150 minutes of moderate-intensity exercise each week, such as brisk walking or water aerobics.  Exercising at least 3 days per week, making sure you allow no more than 2 consecutive days to pass without exercising.  Avoiding long periods of inactivity (90 minutes or more). When you  have to spend an extended period of time sitting down, take frequent breaks to walk or stretch. RECOMMENDATIONS FOR EXERCISING WITH TYPE 1 OR TYPE 2 DIABETES   Check your blood glucose before exercising. If blood glucose levels are greater than 240 mg/dL, check for urine ketones. Do not exercise if ketones are present.  Avoid injecting insulin into areas of the body that are going to be exercised. For example, avoid injecting insulin into:  The arms when playing tennis.  The legs when jogging.  Keep a record of:  Food intake before and after you exercise.  Expected peak times of insulin action.  Blood glucose levels before and after you exercise.  The type and amount of exercise you have done.  Review your records with your health care provider. Your health care provider will help you to develop guidelines for adjusting food intake and insulin amounts before and after exercising.  If you take insulin or oral hypoglycemic agents, watch for signs and symptoms of hypoglycemia. They include:  Dizziness.  Shaking.  Sweating.  Chills.  Confusion.  Drink plenty of water while you exercise to prevent dehydration or heat stroke. Body water is lost during exercise and must be replaced.  Talk to your health care provider before starting an exercise program to make sure it is safe for you. Remember, almost any type of activity is better than none.   This information is not intended to replace advice given to you by your health care provider. Make sure you discuss any questions you have with your health care provider.   Document Released: 04/19/2003 Document Revised: 06/13/2014 Document Reviewed: 07/06/2012 Elsevier Interactive Patient Education 2016 Elsevier Inc.  

## 2015-04-30 ENCOUNTER — Telehealth: Payer: Self-pay

## 2015-04-30 NOTE — Telephone Encounter (Signed)
-----   Message from Massie MaroonLachina M Hollis, [place]NP sent at 04/29/2015  2:14 PM EDT ----- Regarding: lab results Please inform patient that hemoglobin a1C has decreased from 6.7 to 6.0%. Please continue metformin 500 mg with breakfast as previously prescribed. Will follow up in 6 months as scheduled.   Massie MaroonHollis,Lachina M, FNP ----- Message -----    From: [facility] in [facility] Interface    Sent: 04/27/2015   9:40 PM      To: Massie MaroonLachina M Hollis, FNP

## 2015-04-30 NOTE — Telephone Encounter (Signed)
Called and left message advising patient to call back regarding labs. Thanks!  

## 2015-06-26 ENCOUNTER — Ambulatory Visit: Payer: BC Managed Care – PPO | Admitting: Family Medicine

## 2015-10-21 ENCOUNTER — Other Ambulatory Visit: Payer: Self-pay | Admitting: Family Medicine

## 2015-10-21 DIAGNOSIS — E119 Type 2 diabetes mellitus without complications: Secondary | ICD-10-CM

## 2015-10-29 ENCOUNTER — Telehealth: Payer: Self-pay

## 2015-10-29 ENCOUNTER — Encounter: Payer: Self-pay | Admitting: Family Medicine

## 2015-10-29 ENCOUNTER — Ambulatory Visit (INDEPENDENT_AMBULATORY_CARE_PROVIDER_SITE_OTHER): Payer: BLUE CROSS/BLUE SHIELD | Admitting: Family Medicine

## 2015-10-29 VITALS — BP 130/80 | HR 76 | Temp 98.4°F | Resp 14 | Ht 72.0 in | Wt 230.0 lb

## 2015-10-29 DIAGNOSIS — E119 Type 2 diabetes mellitus without complications: Secondary | ICD-10-CM | POA: Diagnosis not present

## 2015-10-29 LAB — CBC WITH DIFFERENTIAL/PLATELET
BASOS PCT: 1 %
Basophils Absolute: 56 cells/uL (ref 0–200)
EOS PCT: 3 %
Eosinophils Absolute: 168 cells/uL (ref 15–500)
HCT: 41.2 % (ref 38.5–50.0)
HEMOGLOBIN: 13.6 g/dL (ref 13.2–17.1)
LYMPHS ABS: 2352 {cells}/uL (ref 850–3900)
Lymphocytes Relative: 42 %
MCH: 29.1 pg (ref 27.0–33.0)
MCHC: 33 g/dL (ref 32.0–36.0)
MCV: 88.2 fL (ref 80.0–100.0)
MONO ABS: 336 {cells}/uL (ref 200–950)
MPV: 9.5 fL (ref 7.5–12.5)
Monocytes Relative: 6 %
Neutro Abs: 2688 cells/uL (ref 1500–7800)
Neutrophils Relative %: 48 %
Platelets: 229 10*3/uL (ref 140–400)
RBC: 4.67 MIL/uL (ref 4.20–5.80)
RDW: 13.5 % (ref 11.0–15.0)
WBC: 5.6 10*3/uL (ref 3.8–10.8)

## 2015-10-29 LAB — LIPID PANEL
CHOL/HDL RATIO: 2.7 ratio (ref <=5.0)
Cholesterol: 129 mg/dL (ref 125–200)
HDL: 47 mg/dL (ref >=40)
LDL CALC: 74 mg/dL (ref <130)
Triglycerides: 41 mg/dL (ref <150)
VLDL: 8 mg/dL (ref <30)

## 2015-10-29 LAB — COMPLETE METABOLIC PANEL WITH GFR
ALBUMIN: 4.5 g/dL (ref 3.6–5.1)
ALT: 14 U/L (ref 9–46)
AST: 18 U/L (ref 10–40)
Alkaline Phosphatase: 80 U/L (ref 40–115)
BUN: 12 mg/dL (ref 7–25)
CHLORIDE: 105 mmol/L (ref 98–110)
CO2: 27 mmol/L (ref 20–31)
CREATININE: 0.91 mg/dL (ref 0.60–1.35)
Calcium: 9.8 mg/dL (ref 8.6–10.3)
GFR, Est African American: >89 mL/min (ref >=60)
GFR, Est Non African American: >89 mL/min (ref >=60)
GLUCOSE: 90 mg/dL (ref 65–99)
POTASSIUM: 4 mmol/L (ref 3.5–5.3)
SODIUM: 141 mmol/L (ref 135–146)
Total Bilirubin: 0.5 mg/dL (ref 0.2–1.2)
Total Protein: 7.4 g/dL (ref 6.1–8.1)

## 2015-10-29 LAB — POCT GLYCOSYLATED HEMOGLOBIN (HGB A1C): HEMOGLOBIN A1C: 5.7

## 2015-10-29 MED ORDER — METFORMIN HCL 500 MG PO TABS
500.0000 mg | ORAL_TABLET | Freq: Every day | ORAL | 1 refills | Status: DC
Start: 2015-10-29 — End: 2016-05-20

## 2015-10-30 LAB — MICROALBUMIN, URINE: MICROALB UR: 0.8 mg/dL

## 2015-10-31 NOTE — Progress Notes (Signed)
Brian Medina, is a 35 y.o. male  ZOX:[id number]  [id number]  DOB - Sep 16, 1980  CC:  Chief Complaint  Patient presents with  . Diabetes       HPI: Brian Medina is a 34 y.o. male here to follow-up on diabetes. He takes metformin 500 mg daily. His last A1C was 6.0 in March. Today is 5.7. He reports taking his metformin regularly and reports no  Concerns. His ROS is negative and is recorded below. He reports eating low carbs, low fat. He reports some walking and some weight lifting. He denies tobacco use, drinks occ alcohol and denies drug use.   He declines a flu shot today.  Allergies  Allergen Reactions  . Other Anaphylaxis    Mushrooms  . Mushroom Extract Complex    Past Medical History:  Diagnosis Date  . Diabetes mellitus (HCC)    Current Outpatient Prescriptions on File Prior to Visit  Medication Sig Dispense Refill  . acetaminophen (TYLENOL) 325 MG tablet Take 650 mg by mouth every 6 (six) hours as needed (pain).    Marland Kitchen ibuprofen (ADVIL,MOTRIN) 200 MG tablet Take 200 mg by mouth every 6 (six) hours as needed (pain).    . magic mouthwash SOLN Take 5 mLs by mouth 3 (three) times daily as needed for mouth pain. (Patient not taking: Reported on 10/29/2015) 15 mL 0   No current facility-administered medications on file prior to visit.    Family History  Problem Relation Age of Onset  . Diabetes Father   . Diabetes Paternal Grandfather    Social History   Social History  . Marital status: Single    Spouse name: N/A  . Number of children: N/A  . Years of education: N/A   Occupational History  . Not on file.   Social History Main Topics  . Smoking status: Never Smoker  . Smokeless tobacco: Never Used  . Alcohol use Yes     Comment: occ  . Drug use: No  . Sexual activity: Yes    Birth control/ protection: Condom   Other Topics Concern  . Not on file   Social History Narrative  . No narrative on file    Review of Systems: Constitutional:  Negative Skin: Negative HENT: Negative  Eyes: Negative  Neck: Negative Respiratory: Negative Cardiovascular: Negative Gastrointestinal: Negative Genitourinary: Negative  Musculoskeletal: Negative   Neurological: Negative  Hematological: Negative  Psychiatric/Behavioral: Negative    Objective:   Vitals:   10/29/15 1311  BP: 130/80  Pulse: 76  Resp: 14  Temp: 98.4 F (36.9 C)    Physical Exam: Constitutional: Patient appears well-developed and well-nourished. No distress.Morbidly obese. HENT: Normocephalic, atraumatic, External right and left ear normal. Oropharynx is clear and moist.  Eyes: Conjunctivae and EOM are normal. PERRLA, no scleral icterus. Neck: Normal ROM. Neck supple. No lymphadenopathy, No thyromegaly. CVS: RRR, S1/S2 +, no murmurs, no gallops, no rubs Pulmonary: Effort and breath sounds normal, no stridor, rhonchi, wheezes, rales.  Abdominal: Soft. Normoactive BS,, no distension, tenderness, rebound or guarding.  Musculoskeletal: Normal range of motion. No edema and no tenderness.  Neuro: Alert.Normal muscle tone coordination. Non-focal Skin: Skin is warm and dry. No rash noted. Not diaphoretic. No erythema. No pallor. Psychiatric: Normal mood and affect. Behavior, judgment, thought content normal.  Lab Results  Component Value Date   WBC 5.6 10/29/2015   HGB 13.6 10/29/2015   HCT 41.2 10/29/2015   MCV 88.2 10/29/2015   PLT 229 10/29/2015   Lab Results  Component Value Date   CREATININE 0.91 10/29/2015   BUN 12 10/29/2015   NA 141 10/29/2015   K 4.0 10/29/2015   CL 105 10/29/2015   CO2 27 10/29/2015    Lab Results  Component Value Date   HGBA1C 5.7 10/29/2015   Lipid Panel     Component Value Date/Time   CHOL 129 10/29/2015 1355   TRIG 41 10/29/2015 1355   HDL 47 10/29/2015 1355   CHOLHDL 2.7 10/29/2015 1355   VLDL 8 10/29/2015 1355   LDLCALC 74 10/29/2015 1355       Assessment and plan:   1. Type 2 diabetes mellitus without  complication, unspecified long term insulin use status (HCC)  - HgB A1c - CBC with Differential - COMPLETE METABOLIC PANEL WITH GFR - Lipid panel - Microalbumin, urine  2. Type 2 diabetes mellitus without complication, without long-term current use of insulin (HCC)  - metFORMIN (GLUCOPHAGE) 500 MG tablet; Take 1 tablet (500 mg total) by mouth daily with breakfast.  Dispense: 90 tablet; Refill: 1   Return in about 6 months (around 04/27/2016).  The patient was given clear instructions to go to ER or return to medical center if symptoms don't improve, worsen or new problems develop. The patient verbalized understanding.    Henrietta HooverLinda C Edis Huish FNP  10/31/2015, 10:58 AM

## 2016-01-02 NOTE — Telephone Encounter (Signed)
ERROR

## 2016-04-28 ENCOUNTER — Encounter: Payer: Self-pay | Admitting: Family Medicine

## 2016-04-28 ENCOUNTER — Ambulatory Visit (INDEPENDENT_AMBULATORY_CARE_PROVIDER_SITE_OTHER): Payer: BLUE CROSS/BLUE SHIELD | Admitting: Family Medicine

## 2016-04-28 VITALS — BP 127/80 | HR 78 | Temp 98.2°F | Resp 14 | Ht 72.0 in | Wt 242.0 lb

## 2016-04-28 DIAGNOSIS — Z23 Encounter for immunization: Secondary | ICD-10-CM

## 2016-04-28 DIAGNOSIS — E119 Type 2 diabetes mellitus without complications: Secondary | ICD-10-CM | POA: Diagnosis not present

## 2016-04-28 DIAGNOSIS — J302 Other seasonal allergic rhinitis: Secondary | ICD-10-CM

## 2016-04-28 DIAGNOSIS — Z114 Encounter for screening for human immunodeficiency virus [HIV]: Secondary | ICD-10-CM

## 2016-04-28 DIAGNOSIS — R7303 Prediabetes: Secondary | ICD-10-CM | POA: Insufficient documentation

## 2016-04-28 LAB — COMPLETE METABOLIC PANEL WITH GFR
ALBUMIN: 4.3 g/dL (ref 3.6–5.1)
ALK PHOS: 87 U/L (ref 40–115)
ALT: 25 U/L (ref 9–46)
AST: 22 U/L (ref 10–40)
BILIRUBIN TOTAL: 0.4 mg/dL (ref 0.2–1.2)
BUN: 14 mg/dL (ref 7–25)
CALCIUM: 9.3 mg/dL (ref 8.6–10.3)
CO2: 24 mmol/L (ref 20–31)
CREATININE: 0.98 mg/dL (ref 0.60–1.35)
Chloride: 107 mmol/L (ref 98–110)
GFR, Est Non African American: >89 mL/min (ref >=60)
Glucose, Bld: 131 mg/dL — ABNORMAL HIGH (ref 65–99)
Potassium: 4 mmol/L (ref 3.5–5.3)
Sodium: 138 mmol/L (ref 135–146)
TOTAL PROTEIN: 7 g/dL (ref 6.1–8.1)

## 2016-04-28 LAB — POCT URINALYSIS DIP (DEVICE)
BILIRUBIN URINE: NEGATIVE
GLUCOSE, UA: NEGATIVE mg/dL
Hgb urine dipstick: NEGATIVE
KETONES UR: NEGATIVE mg/dL
Leukocytes, UA: NEGATIVE
Nitrite: NEGATIVE
PROTEIN: NEGATIVE mg/dL
SPECIFIC GRAVITY, URINE: 1.02 (ref 1.005–1.030)
Urobilinogen, UA: 0.2 mg/dL (ref 0.0–1.0)
pH: 7 (ref 5.0–8.0)

## 2016-04-28 LAB — POCT GLYCOSYLATED HEMOGLOBIN (HGB A1C): HEMOGLOBIN A1C: 5.8

## 2016-04-28 MED ORDER — FLUTICASONE PROPIONATE 50 MCG/ACT NA SUSP: 2.0000 | Freq: Every day | NASAL | 6 refills | Status: DC

## 2016-04-28 MED ORDER — LEVOCETIRIZINE DIHYDROCHLORIDE 5 MG PO TABS: 5.0000 mg | ORAL_TABLET | Freq: Every evening | ORAL | 11 refills | Status: DC

## 2016-04-28 NOTE — Patient Instructions (Addendum)
Allergic Rhinitis Allergic rhinitis is when the mucous membranes in the nose respond to allergens. Allergens are particles in the air that cause your body to have an allergic reaction. This causes you to release allergic antibodies. Through a chain of events, these eventually cause you to release histamine into the blood stream. Although meant to protect the body, it is this release of histamine that causes your discomfort, such as frequent sneezing, congestion, and an itchy, runny nose. What are the causes? Seasonal allergic rhinitis (hay fever) is caused by pollen allergens that may come from grasses, trees, and weeds. Year-round allergic rhinitis (perennial allergic rhinitis) is caused by allergens such as house dust mites, pet dander, and mold spores. What are the signs or symptoms?  Nasal stuffiness (congestion).  Itchy, runny nose with sneezing and tearing of the eyes. How is this diagnosed? Your health care provider can help you determine the allergen or allergens that trigger your symptoms. If you and your health care provider are unable to determine the allergen, skin or blood testing may be used. Your health care provider will diagnose your condition after taking your health history and performing a physical exam. Your health care provider may assess you for other related conditions, such as asthma, pink eye, or an ear infection. How is this treated? Allergic rhinitis does not have a cure, but it can be controlled by:  Medicines that block allergy symptoms. These may include allergy shots, nasal sprays, and oral antihistamines.  Avoiding the allergen. Hay fever may often be treated with antihistamines in pill or nasal spray forms. Antihistamines block the effects of histamine. There are over-the-counter medicines that may help with nasal congestion and swelling around the eyes. Check with your health care provider before taking or giving this medicine. If avoiding the allergen or the  medicine prescribed do not work, there are many new medicines your health care provider can prescribe. Stronger medicine may be used if initial measures are ineffective. Desensitizing injections can be used if medicine and avoidance does not work. Desensitization is when a patient is given ongoing shots until the body becomes less sensitive to the allergen. Make sure you follow up with your health care provider if problems continue. Follow these instructions at home: It is not possible to completely avoid allergens, but you can reduce your symptoms by taking steps to limit your exposure to them. It helps to know exactly what you are allergic to so that you can avoid your specific triggers. Contact a health care provider if:  You have a fever.  You develop a cough that does not stop easily (persistent).  You have shortness of breath.  You start wheezing.  Symptoms interfere with normal daily activities. This information is not intended to replace advice given to you by your health care provider. Make sure you discuss any questions you have with your health care provider. Document Released: 10/22/2000 Document Revised: 09/28/2015 Document Reviewed: 10/04/2012 Elsevier Interactive Patient Education  2017 Elsevier Inc.  

## 2016-04-28 NOTE — Progress Notes (Signed)
Subjective:    Patient ID: Brian Medina, male    DOB: Jun 18, 1980, 36 y.o.   MRN: [id number]  Diabetes  He presents for his follow-up (Patient was diagnosed with type II diabetes 1 month ago. ) diabetic visit. He has type 2 diabetes mellitus. The initial diagnosis of diabetes was made 3 months ago. His disease course has been stable. Pertinent negatives for hypoglycemia include no confusion, dizziness, headaches, hunger, mood changes, pallor, seizures, sleepiness, speech difficulty, sweats or tremors. Pertinent negatives for diabetes include no blurred vision, no chest pain, no fatigue, no foot paresthesias, no foot ulcerations, no polydipsia, no polyphagia, no polyuria, no visual change, no weakness and no weight loss. There are no hypoglycemic complications. Symptoms are stable. There are no diabetic complications. Risk factors for coronary artery disease include sedentary lifestyle. Current diabetic treatment includes diet and oral agent (monotherapy). He is compliant with treatment all of the time. He is following a low fat/cholesterol diet. He has not had a previous visit with a dietitian. He never participates in exercise. An ACE inhibitor/angiotensin II receptor blocker is not being taken. He does not see a podiatrist.Eye exam is not current.    Past Medical History:  Diagnosis Date  . Diabetes mellitus [facility])     Social History   Social History  . Marital status: Single    Spouse name: N/A  . Number of children: N/A  . Years of education: N/A   Occupational History  . Not on file.   Social History Main Topics  . Smoking status: Never Smoker  . Smokeless tobacco: Never Used  . Alcohol use Yes     Comment: occ  . Drug use: No  . Sexual activity: Yes    Birth control/ protection: Condom   Other Topics Concern  . Not on file   Social History Narrative  . No narrative on file   Immunization History  Administered Date(s) Administered  . Influenza,inj,Quad PF,36+ Mos  12/26/2014  . Tdap 12/26/2014   Allergies  Allergen Reactions  . Other Anaphylaxis    Mushrooms  . Mushroom Extract Complex     Review of Systems  Constitutional: Negative.  Negative for fatigue and weight loss.  HENT: Negative.   Eyes: Negative.  Negative for blurred vision.  Respiratory: Negative.   Cardiovascular: Negative.  Negative for chest pain, palpitations and leg swelling.  Gastrointestinal: Negative.  Negative for nausea.  Endocrine: Negative.  Negative for polydipsia, polyphagia and polyuria.  Musculoskeletal: Negative.   Skin: Negative.  Negative for pallor.  Allergic/Immunologic: Negative.   Neurological: Negative for dizziness, tremors, seizures, speech difficulty, weakness and headaches.  Hematological: Negative.   Psychiatric/Behavioral: Negative.  Negative for confusion, sleep disturbance and suicidal ideas.       Objective:   Physical Exam  Constitutional: He is oriented to person, place, and time. He appears well-developed and well-nourished.  HENT:  Head: Normocephalic and atraumatic.  Right Ear: External ear normal.  Left Ear: External ear normal.  Mouth/Throat: Oropharynx is clear and moist.  Eyes: EOM are normal. Pupils are equal, round, and reactive to light.  Neck: Normal range of motion. Neck supple.  Cardiovascular: Normal rate, regular rhythm, normal heart sounds and intact distal pulses.   Pulmonary/Chest: Effort normal and breath sounds normal.  Abdominal: Soft. Bowel sounds are normal.  Musculoskeletal: Normal range of motion.  Neurological: He is alert and oriented to person, place, and time. He has normal strength and normal reflexes. No cranial nerve deficit or sensory deficit.  He displays a negative Romberg sign.  Monofilament test negative  Skin: Skin is warm and dry.  Psychiatric: He has a normal mood and affect. His behavior is normal. Judgment and thought content normal.       BP 127/80 (BP Location: Right Arm, Patient Position:  Sitting, Cuff Size: Large)   Pulse 78   Temp 98.2 F (36.8 C) (Oral)   Resp 14   Ht 6' (1.829 m)   Wt 242 lb (109.8 kg)   SpO2 98%   BMI 32.82 kg/m  Assessment & Plan:        RTC: 6 months for a follow-up of type 2 diabetes mellitus   Lavonte Palos M, FNP

## 2016-04-29 LAB — HIV ANTIBODY (ROUTINE TESTING W REFLEX): HIV 1&2 Ab, 4th Generation: NONREACTIVE

## 2016-05-20 ENCOUNTER — Other Ambulatory Visit: Payer: Self-pay

## 2016-05-20 DIAGNOSIS — E119 Type 2 diabetes mellitus without complications: Secondary | ICD-10-CM

## 2016-05-20 MED ORDER — METFORMIN HCL 500 MG PO TABS: 500.0000 mg | ORAL_TABLET | Freq: Every day | ORAL | 1 refills | Status: DC

## 2016-05-20 NOTE — Telephone Encounter (Signed)
Refill for Metformin has been sent into pharmacy. Thanks!

## 2016-10-30 ENCOUNTER — Ambulatory Visit (INDEPENDENT_AMBULATORY_CARE_PROVIDER_SITE_OTHER): Payer: BLUE CROSS/BLUE SHIELD | Admitting: Family Medicine

## 2016-10-30 ENCOUNTER — Encounter: Payer: Self-pay | Admitting: Family Medicine

## 2016-10-30 VITALS — BP 129/78 | HR 95 | Temp 98.6°F | Resp 16 | Ht 72.0 in | Wt 265.0 lb

## 2016-10-30 DIAGNOSIS — Z23 Encounter for immunization: Secondary | ICD-10-CM | POA: Diagnosis not present

## 2016-10-30 DIAGNOSIS — R635 Abnormal weight gain: Secondary | ICD-10-CM | POA: Diagnosis not present

## 2016-10-30 DIAGNOSIS — R7303 Prediabetes: Secondary | ICD-10-CM | POA: Diagnosis not present

## 2016-10-30 DIAGNOSIS — E119 Type 2 diabetes mellitus without complications: Secondary | ICD-10-CM

## 2016-10-30 DIAGNOSIS — E8881 Metabolic syndrome: Secondary | ICD-10-CM | POA: Diagnosis not present

## 2016-10-30 LAB — POCT URINALYSIS DIP (DEVICE)
Bilirubin Urine: NEGATIVE
Glucose, UA: NEGATIVE mg/dL
Hgb urine dipstick: NEGATIVE
Ketones, ur: NEGATIVE mg/dL
Leukocytes, UA: NEGATIVE
Nitrite: NEGATIVE
PROTEIN: NEGATIVE mg/dL
SPECIFIC GRAVITY, URINE: 1.02 (ref 1.005–1.030)
UROBILINOGEN UA: 1 mg/dL (ref 0.0–1.0)
pH: 8.5 — ABNORMAL HIGH (ref 5.0–8.0)

## 2016-10-30 LAB — POCT GLYCOSYLATED HEMOGLOBIN (HGB A1C): HEMOGLOBIN A1C: 6.1

## 2016-10-30 MED ORDER — METFORMIN HCL 500 MG PO TABS: 500.0000 mg | ORAL_TABLET | Freq: Every day | ORAL | 1 refills | Status: DC

## 2016-10-30 NOTE — Progress Notes (Signed)
Subjective:    Patient ID: Brian Medina, male    DOB: 1980/06/13, 36 y.o.   MRN: [id number]  Ardeth Sportsman, a 36 year old male with a history of type 2 diabetes presents for a 6 month follow up. He has been taking Metformin 500 mg with breakfast consistently. He says that he does not follow a carbohydrate modified diet or exercise consistently. He says that he has been eating out more and is not very active. He has not been checking blood sugars at home. Previous hemoglobin a1C was 5.8, which is consistent with prediabetes.    Diabetes  He presents for his follow-up diabetic visit. He has type 2 diabetes mellitus. The initial diagnosis of diabetes was made 3 months ago. His disease course has been stable. Pertinent negatives for hypoglycemia include no confusion, dizziness, headaches, hunger, mood changes, pallor, seizures, sleepiness, speech difficulty, sweats or tremors. Pertinent negatives for diabetes include no blurred vision, no chest pain, no fatigue, no foot paresthesias, no foot ulcerations, no polydipsia, no polyphagia, no polyuria, no visual change, no weakness and no weight loss. There are no hypoglycemic complications. Symptoms are stable. There are no diabetic complications. Risk factors for coronary artery disease include sedentary lifestyle. Current diabetic treatment includes diet and oral agent (monotherapy). He is compliant with treatment all of the time. He has not had a previous visit with a dietitian. He never participates in exercise. An ACE inhibitor/angiotensin II receptor blocker is not being taken. He does not see a podiatrist.Eye exam is not current.    Past Medical History:  Diagnosis Date  . Diabetes mellitus [facility])     Social History   Social History  . Marital status: Single    Spouse name: N/A  . Number of children: N/A  . Years of education: N/A   Occupational History  . Not on file.   Social History Main Topics  . Smoking status: Never Smoker  .  Smokeless tobacco: Never Used  . Alcohol use Yes     Comment: occ  . Drug use: No  . Sexual activity: Yes    Birth control/ protection: Condom   Other Topics Concern  . Not on file   Social History Narrative  . No narrative on file   Immunization History  Administered Date(s) Administered  . Influenza,inj,Quad PF,6+ Mos 12/26/2014  . Pneumococcal Polysaccharide-23 04/28/2016  . Tdap 12/26/2014   Allergies  Allergen Reactions  . Other Anaphylaxis    Mushrooms  . Mushroom Extract Complex     Review of Systems  Constitutional: Negative.  Negative for fatigue and weight loss.  HENT: Negative.   Eyes: Negative.  Negative for blurred vision.  Respiratory: Negative.   Cardiovascular: Negative.  Negative for chest pain, palpitations and leg swelling.  Gastrointestinal: Negative.  Negative for nausea.  Endocrine: Negative.  Negative for polydipsia, polyphagia and polyuria.  Musculoskeletal: Negative.   Skin: Negative.  Negative for pallor.  Allergic/Immunologic: Negative.   Neurological: Negative for dizziness, tremors, seizures, speech difficulty, weakness and headaches.  Hematological: Negative.   Psychiatric/Behavioral: Negative.  Negative for confusion, sleep disturbance and suicidal ideas.       Objective:   Physical Exam  Constitutional: He is oriented to person, place, and time. He appears well-developed and well-nourished.  HENT:  Head: Normocephalic and atraumatic.  Right Ear: External ear normal.  Left Ear: External ear normal.  Mouth/Throat: Oropharynx is clear and moist.  Eyes: Pupils are equal, round, and reactive to light. EOM are normal.  Neck: Normal range of motion. Neck supple.  Cardiovascular: Normal rate, regular rhythm, normal heart sounds and intact distal pulses.   Pulmonary/Chest: Effort normal and breath sounds normal.  Abdominal: Soft. Bowel sounds are normal.  Musculoskeletal: Normal range of motion.  Neurological: He is alert and oriented to  person, place, and time. He has normal strength and normal reflexes. No cranial nerve deficit or sensory deficit. He displays a negative Romberg sign.  Monofilament test negative  Skin: Skin is warm and dry.  Psychiatric: He has a normal mood and affect. His behavior is normal. Judgment and thought content normal.       BP 129/78 (BP Location: Left Arm, Patient Position: Sitting, Cuff Size: Large)   Pulse 95   Temp 98.6 F (37 C) (Oral)   Resp 16   Ht 6' (1.829 m)   Wt 265 lb (120.2 kg)   SpO2 100%   BMI 35.94 kg/m  Assessment & Plan:   1. Weight gain Patient has gained a total of 45 pounds since December 2016. Recommend a lowfat, low carbohydrate diet divided over 5-6 small meals, increase water intake to 6-8 glasses, and 150 minutes per week of cardiovascular exercise. Given information for 1800 calorie carbohydrate modified diet.  - Lipid Panel - TSH  2. Metabolic syndrome The patient is asked to make an attempt to improve diet and exercise patterns to aid in medical management of this problem.  - Lipid Panel - TSH - COMPLETE METABOLIC PANEL WITH GFR  3. Prediabetes Your A1C goal is less than 7. Your fasting blood sugar  Upon awakening goal is between 110-140.  Your LDL  (bad cholesterol goal is less than 100 Blood pressure goal is <140/90.  Recommend a lowfat, low carbohydrate diet divided over 5-6 small meals, increase water intake to 6-8 glasses, and 150 minutes per week of cardiovascular exercise.   Take your medications as prescribed Make sure that you are familiar with each one of your medications and what they are used to treat.  If you are unsure of medications, please bring to follow up Will send referral for eye exam  Please keep your scheduled follow up appointment.   - HgB A1c - metFORMIN (GLUCOPHAGE) 500 MG tablet; Take 1 tablet (500 mg total) by mouth daily with breakfast.   4. Influenza vaccination given - Flu Vaccine QUAD 6+ mos PF IM (Fluarix Quad  PF)    RTC: 6 months for a follow-up of prediabetes and weight gain.   Nolon Nations  MSN, FNP-C [facility] [address] [place], [place] [address] [phone]

## 2016-10-30 NOTE — Patient Instructions (Addendum)
Your A1C goal is less than 7. Your fasting blood sugar  Upon awakening goal is between 110-140.  Your LDL  (bad cholesterol goal is less than 100 Blood pressure goal is <140/90.  Recommend a lowfat, low carbohydrate diet divided over 5-6 small meals, increase water intake to 6-8 glasses, and 150 minutes per week of cardiovascular exercise.   Take your medications as prescribed Make sure that you are familiar with each one of your medications and what they are used to treat.  If you are unsure of medications, please bring to follow up Will send referral for eye exam  Please keep your scheduled follow up appointment.    Recommend a lowfat, low carbohydrate diet divided over 5-6 small meals, increase water intake to 6-8 glasses, and 150 minutes per week of cardiovascular exercise.    Carbohydrate Counting for Diabetes Mellitus, Adult Carbohydrate counting is a method for keeping track of how many carbohydrates you eat. Eating carbohydrates naturally increases the amount of sugar (glucose) in the blood. Counting how many carbohydrates you eat helps keep your blood glucose within normal limits, which helps you manage your diabetes (diabetes mellitus). It is important to know how many carbohydrates you can safely have in each meal. This is different for every person. A diet and nutrition specialist (registered dietitian) can help you make a meal plan and calculate how many carbohydrates you should have at each meal and snack. Carbohydrates are found in the following foods:  Grains, such as breads and cereals.  Dried beans and soy products.  Starchy vegetables, such as potatoes, peas, and corn.  Fruit and fruit juices.  Milk and yogurt.  Sweets and snack foods, such as cake, cookies, candy, chips, and soft drinks.  How do I count carbohydrates? There are two ways to count carbohydrates in food. You can use either of the methods or a combination of both. Reading "Nutrition Facts" on  packaged food The "Nutrition Facts" list is included on the labels of almost all packaged foods and beverages in the [place] It includes:  The serving size.  Information about nutrients in each serving, including the grams (g) of carbohydrate per serving.  To use the "Nutrition Facts":  Decide how many servings you will have.  Multiply the number of servings by the number of carbohydrates per serving.  The resulting number is the total amount of carbohydrates that you will be having.  Learning standard serving sizes of other foods When you eat foods containing carbohydrates that are not packaged or do not include "Nutrition Facts" on the label, you need to measure the servings in order to count the amount of carbohydrates:  Measure the foods that you will eat with a food scale or measuring cup, if needed.  Decide how many standard-size servings you will eat.  Multiply the number of servings by 15. Most carbohydrate-rich foods have about 15 g of carbohydrates per serving. ? For example, if you eat 8 oz (170 g) of strawberries, you will have eaten 2 servings and 30 g of carbohydrates (2 servings x 15 g = 30 g).  For foods that have more than one food mixed, such as soups and casseroles, you must count the carbohydrates in each food that is included.  The following list contains standard serving sizes of common carbohydrate-rich foods. Each of these servings has about 15 g of carbohydrates:   hamburger bun or  English muffin.   oz (15 mL) syrup.   oz (14 g) jelly.  1  slice of bread.  1 six-inch tortilla.  3 oz (85 g) cooked rice or pasta.  4 oz (113 g) cooked dried beans.  4 oz (113 g) starchy vegetable, such as peas, corn, or potatoes.  4 oz (113 g) hot cereal.  4 oz (113 g) mashed potatoes or  of a large baked potato.  4 oz (113 g) canned or frozen fruit.  4 oz (120 mL) fruit juice.  4-6 crackers.  6 chicken nuggets.  6 oz (170 g) unsweetened dry cereal.  6  oz (170 g) plain fat-free yogurt or yogurt sweetened with artificial sweeteners.  8 oz (240 mL) milk.  8 oz (170 g) fresh fruit or one small piece of fruit.  24 oz (680 g) popped popcorn.  Example of carbohydrate counting Sample meal  3 oz (85 g) chicken breast.  6 oz (170 g) brown rice.  4 oz (113 g) corn.  8 oz (240 mL) milk.  8 oz (170 g) strawberries with sugar-free whipped topping. Carbohydrate calculation 1. Identify the foods that contain carbohydrates: ? Rice. ? Corn. ? Milk. ? Strawberries. 2. Calculate how many servings you have of each food: ? 2 servings rice. ? 1 serving corn. ? 1 serving milk. ? 1 serving strawberries. 3. Multiply each number of servings by 15 g: ? 2 servings rice x 15 g = 30 g. ? 1 serving corn x 15 g = 15 g. ? 1 serving milk x 15 g = 15 g. ? 1 serving strawberries x 15 g = 15 g. 4. Add together all of the amounts to find the total grams of carbohydrates eaten: ? 30 g + 15 g + 15 g + 15 g = 75 g of carbohydrates total. This information is not intended to replace advice given to you by your health care provider. Make sure you discuss any questions you have with your health care provider. Document Released: 01/27/2005 Document Revised: 08/17/2015 Document Reviewed: 07/11/2015 Elsevier Interactive Patient Education  [employer].

## 2016-10-31 LAB — COMPLETE METABOLIC PANEL WITH GFR
AG Ratio: 1.5 (calc) (ref 1.0–2.5)
ALKALINE PHOSPHATASE (APISO): 93 U/L (ref 40–115)
ALT: 37 U/L (ref 9–46)
AST: 26 U/L (ref 10–40)
Albumin: 4.4 g/dL (ref 3.6–5.1)
BUN: 15 mg/dL (ref 7–25)
CO2: 25 mmol/L (ref 20–32)
CREATININE: 1.01 mg/dL (ref 0.60–1.35)
Calcium: 9.4 mg/dL (ref 8.6–10.3)
Chloride: 105 mmol/L (ref 98–110)
GFR, Est African American: 110 mL/min/{1.73_m2} (ref >=60)
GFR, Est Non African American: 95 mL/min/{1.73_m2} (ref >=60)
GLUCOSE: 103 mg/dL — AB (ref 65–99)
Globulin: 2.9 g/dL (calc) (ref 1.9–3.7)
Potassium: 4.2 mmol/L (ref 3.5–5.3)
SODIUM: 139 mmol/L (ref 135–146)
Total Bilirubin: 0.4 mg/dL (ref 0.2–1.2)
Total Protein: 7.3 g/dL (ref 6.1–8.1)

## 2016-10-31 LAB — LIPID PANEL
CHOL/HDL RATIO: 4.4 (calc) (ref <5.0)
Cholesterol: 155 mg/dL (ref <200)
HDL: 35 mg/dL — ABNORMAL LOW (ref >40)
LDL Cholesterol (Calc): 93 mg/dL (calc)
NON-HDL CHOLESTEROL (CALC): 120 mg/dL (ref <130)
Triglycerides: 173 mg/dL — ABNORMAL HIGH (ref <150)

## 2016-10-31 LAB — TSH: TSH: 1 mIU/L (ref 0.40–4.50)

## 2016-11-02 ENCOUNTER — Other Ambulatory Visit: Payer: Self-pay | Admitting: Family Medicine

## 2016-11-02 DIAGNOSIS — E781 Pure hyperglyceridemia: Secondary | ICD-10-CM | POA: Insufficient documentation

## 2016-11-03 ENCOUNTER — Telehealth: Payer: Self-pay

## 2016-11-03 NOTE — Telephone Encounter (Signed)
-----   Message from Massie Maroon, [place] sent at 11/02/2016 10:25 PM EDT ----- Regarding: lab results Please inform Mr. Brian Medina that triglycerides are elevated at 173, goal is < 150. Recommend a lowfat, low carbohydrate diet divided over 5-6 small meals, increase water intake to 6-8 glasses, and 150 minutes per week of cardiovascular exercise. Will recheck levels in 6 months   Thanks

## 2016-11-03 NOTE — Telephone Encounter (Signed)
Called no answer. Left a message for patient to return our call. Thanks!

## 2016-11-04 NOTE — Telephone Encounter (Signed)
Called and spoke with patient, advised that triglycerides are elevated and that the recommendation is to eat a low fat/low carb diet over 5 to 6 small meals daily, increase water intake to 6 to 8 glasses a day, and exercise 150 minutes weekly of cardio. Advised that we will recheck in 6 months and asked that patient keep next scheduled follow up. Thanks!

## 2016-11-24 ENCOUNTER — Other Ambulatory Visit: Payer: Self-pay | Admitting: Family Medicine

## 2016-11-24 DIAGNOSIS — E119 Type 2 diabetes mellitus without complications: Secondary | ICD-10-CM

## 2016-12-10 ENCOUNTER — Other Ambulatory Visit: Payer: Self-pay | Admitting: Family Medicine

## 2016-12-10 DIAGNOSIS — J302 Other seasonal allergic rhinitis: Secondary | ICD-10-CM

## 2017-01-11 ENCOUNTER — Other Ambulatory Visit: Payer: Self-pay | Admitting: Family Medicine

## 2017-01-11 DIAGNOSIS — J302 Other seasonal allergic rhinitis: Secondary | ICD-10-CM

## 2017-01-15 IMAGING — DX DG CHEST 2V
2 series · 2 of 2 positions shown · non-contrast
Comparison: None.

CLINICAL DATA: Hemoptysis.

EXAM:
CHEST  2 VIEW

[chest pa]
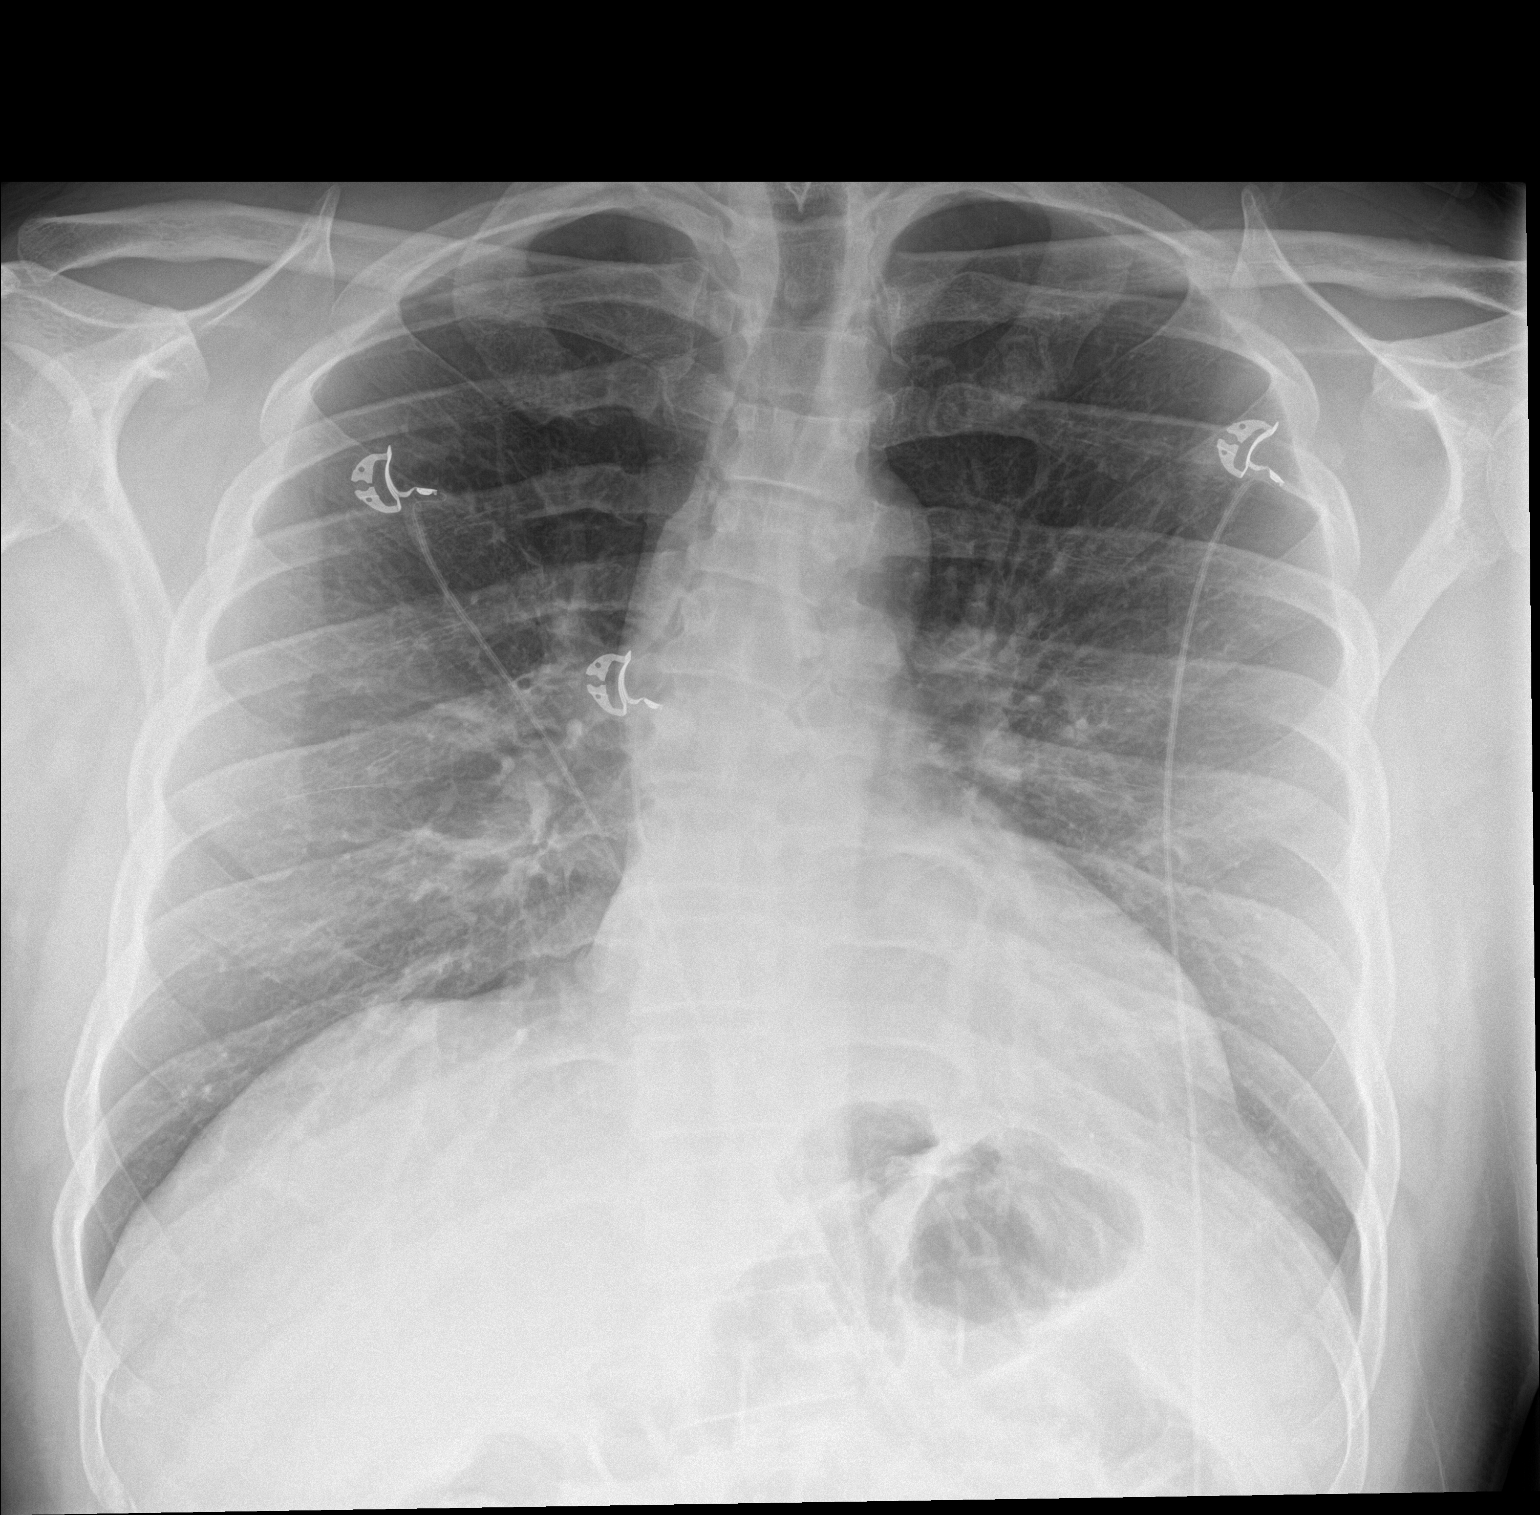

[chest lat]
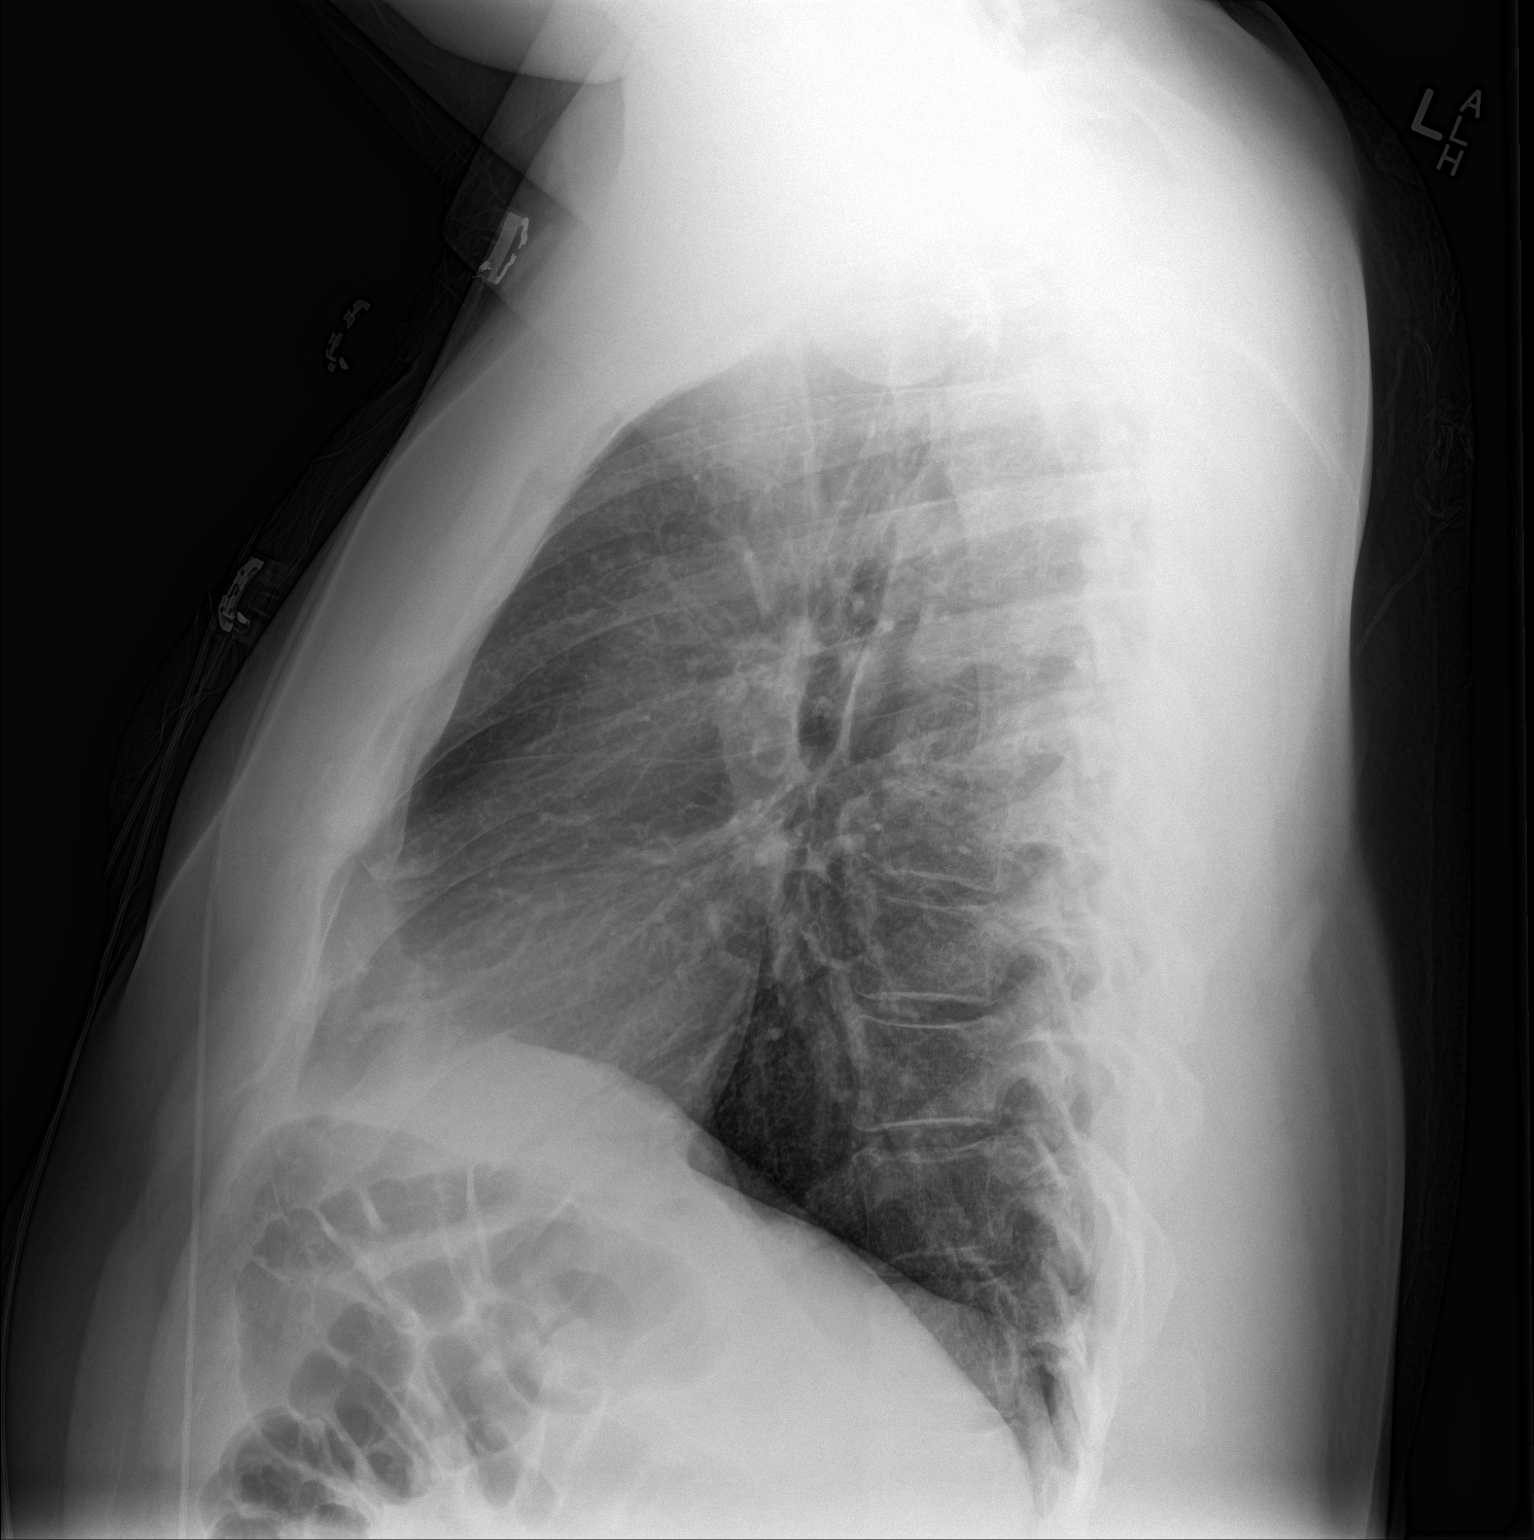

[2 of 2 positions shown; findings below may reference images not displayed]

FINDINGS: Normal heart size. Normal mediastinal contour. No pneumothorax. No
pleural effusion. Clear lungs, with no focal lung consolidation and
no pulmonary edema.
IMPRESSION: No active cardiopulmonary disease.

## 2017-02-11 ENCOUNTER — Other Ambulatory Visit: Payer: Self-pay | Admitting: Family Medicine

## 2017-02-11 DIAGNOSIS — J302 Other seasonal allergic rhinitis: Secondary | ICD-10-CM

## 2017-03-11 ENCOUNTER — Other Ambulatory Visit: Payer: Self-pay | Admitting: Family Medicine

## 2017-03-11 DIAGNOSIS — J302 Other seasonal allergic rhinitis: Secondary | ICD-10-CM

## 2017-03-11 DIAGNOSIS — E119 Type 2 diabetes mellitus without complications: Secondary | ICD-10-CM

## 2017-04-10 ENCOUNTER — Other Ambulatory Visit: Payer: Self-pay | Admitting: Family Medicine

## 2017-04-10 DIAGNOSIS — J302 Other seasonal allergic rhinitis: Secondary | ICD-10-CM

## 2017-04-29 ENCOUNTER — Ambulatory Visit (INDEPENDENT_AMBULATORY_CARE_PROVIDER_SITE_OTHER): Payer: BLUE CROSS/BLUE SHIELD | Admitting: Family Medicine

## 2017-04-29 ENCOUNTER — Encounter: Payer: Self-pay | Admitting: Family Medicine

## 2017-04-29 VITALS — BP 123/75 | HR 89 | Temp 98.3°F | Resp 16 | Ht 72.0 in | Wt 260.0 lb

## 2017-04-29 DIAGNOSIS — E119 Type 2 diabetes mellitus without complications: Secondary | ICD-10-CM | POA: Diagnosis not present

## 2017-04-29 DIAGNOSIS — E669 Obesity, unspecified: Secondary | ICD-10-CM | POA: Diagnosis not present

## 2017-04-29 LAB — POCT GLYCOSYLATED HEMOGLOBIN (HGB A1C): HEMOGLOBIN A1C: 6

## 2017-04-29 LAB — POCT URINALYSIS DIP (DEVICE)
BILIRUBIN URINE: NEGATIVE
GLUCOSE, UA: NEGATIVE mg/dL
Hgb urine dipstick: NEGATIVE
KETONES UR: NEGATIVE mg/dL
Leukocytes, UA: NEGATIVE
Nitrite: NEGATIVE
Protein, ur: NEGATIVE mg/dL
SPECIFIC GRAVITY, URINE: 1.025 (ref 1.005–1.030)
Urobilinogen, UA: 0.2 mg/dL (ref 0.0–1.0)
pH: 7 (ref 5.0–8.0)

## 2017-04-29 NOTE — Progress Notes (Signed)
Subjective:    Patient ID: Brian Medina, male    DOB: 09/25/1980, 37 y.o.   MRN: [id number]  Brian Medina, a 37 year old male with a history of type 2 diabetes presents for a 6 month follow up. He has been taking Metformin 500 mg with breakfast consistently. He says that he does not follow a carbohydrate modified diet or exercise consistently. Patient recently discontinued soft drinks. He has not been checking blood sugars at home. Previous hemoglobin a1C was 6.1 , which is at goal.    Diabetes  He presents for his follow-up diabetic visit. He has type 2 diabetes mellitus. His disease course has been stable. Pertinent negatives for hypoglycemia include no confusion, dizziness, headaches, hunger, mood changes, pallor, seizures, sleepiness, speech difficulty, sweats or tremors. Pertinent negatives for diabetes include no blurred vision, no chest pain, no fatigue, no foot paresthesias, no foot ulcerations, no polydipsia, no polyphagia, no polyuria, no visual change, no weakness and no weight loss. There are no hypoglycemic complications. Symptoms are stable. There are no diabetic complications. Risk factors for coronary artery disease include sedentary lifestyle. Current diabetic treatment includes diet and oral agent (monotherapy). He is compliant with treatment all of the time. He has not had a previous visit with a dietitian. He never participates in exercise. An ACE inhibitor/angiotensin II receptor blocker is not being taken. He does not see a podiatrist.Eye exam is not current.    Past Medical History:  Diagnosis Date  . Diabetes mellitus (HCC)     Social History   Socioeconomic History  . Marital status: Single    Spouse name: Not on file  . Number of children: Not on file  . Years of education: Not on file  . Highest education level: Not on file  Social Needs  . Financial resource strain: Not on file  . Food insecurity - worry: Not on file  . Food insecurity - inability: Not on  file  . Transportation needs - medical: Not on file  . Transportation needs - non-medical: Not on file  Occupational History  . Not on file  Tobacco Use  . Smoking status: Never Smoker  . Smokeless tobacco: Never Used  Substance and Sexual Activity  . Alcohol use: Yes    Comment: occ  . Drug use: No  . Sexual activity: Yes    Birth control/protection: Condom  Other Topics Concern  . Not on file  Social History Narrative  . Not on file   Immunization History  Administered Date(s) Administered  . Influenza,inj,Quad PF,6+ Mos 12/26/2014, 10/30/2016  . Pneumococcal Polysaccharide-23 04/28/2016  . Tdap 12/26/2014   Allergies  Allergen Reactions  . Other Anaphylaxis    Mushrooms  . Mushroom Extract Complex     Review of Systems  Constitutional: Negative.  Negative for fatigue and weight loss.  HENT: Negative.   Eyes: Negative.  Negative for blurred vision.  Respiratory: Negative.   Cardiovascular: Negative.  Negative for chest pain, palpitations and leg swelling.  Gastrointestinal: Negative.  Negative for nausea.  Endocrine: Negative.  Negative for polydipsia, polyphagia and polyuria.  Musculoskeletal: Negative.   Skin: Negative.  Negative for pallor.  Allergic/Immunologic: Negative.   Neurological: Negative for dizziness, tremors, seizures, speech difficulty, weakness and headaches.  Hematological: Negative.   Psychiatric/Behavioral: Negative.  Negative for confusion, sleep disturbance and suicidal ideas.       Objective:   Physical Exam  Constitutional: He is oriented to person, place, and time. He appears well-developed and well-nourished.  HENT:  Head: Normocephalic and atraumatic.  Right Ear: External ear normal.  Left Ear: External ear normal.  Mouth/Throat: Oropharynx is clear and moist.  Eyes: EOM are normal. Pupils are equal, round, and reactive to light.  Neck: Normal range of motion. Neck supple.  Cardiovascular: Normal rate, regular rhythm, normal  heart sounds and intact distal pulses.  Pulmonary/Chest: Effort normal and breath sounds normal.  Abdominal: Soft. Bowel sounds are normal.  Musculoskeletal: Normal range of motion.  Neurological: He is alert and oriented to person, place, and time. He has normal strength and normal reflexes. No cranial nerve deficit or sensory deficit. He displays a negative Romberg sign.  Monofilament test negative  Skin: Skin is warm and dry.  Psychiatric: He has a normal mood and affect. His behavior is normal. Judgment and thought content normal.       BP 123/75 (BP Location: Left Arm, Patient Position: Sitting, Cuff Size: Large)   Pulse 89   Temp 98.3 F (36.8 C) (Oral)   Resp 16   Ht 6' (1.829 m)   Wt 260 lb (117.9 kg)   SpO2 98%   BMI 35.26 kg/m  Assessment & Plan:  1. Type 2 diabetes mellitus without complication, without long-term current use of insulin (HCC) Hemoglobin a1C is 6.0, which is at goal. Will continue Metformin at 500 mg daily.  Recommend a carbohydrate modified diet.  - HgB A1c - POCT urinalysis dip (device) - Basic Metabolic Panel - Microalbumin/Creatinine Ratio, Urine - Ambulatory referral to Ophthalmology  2. Obesity (BMI 30-39.9) Recommend that [name] continue exercising 3-5 days per week. The patient is asked to make an attempt to improve diet and exercise patterns to aid in medical management of this problem.   RTC: 6 months for DMII  Nolon Nations  MSN, FNP-C Patient Care [facility] [address] [place], [place] [address] [phone]

## 2017-04-29 NOTE — Patient Instructions (Signed)
Your hemoglobin a1C is 6.0, which is at goal. Will continue Metformin 500 mg daily with breakfast.   Your A1C goal is less than 7. Your fasting blood sugar  Upon awakening goal is between 110-140.   Blood pressure goal is <140/90.  Recommend a lowfat, low carbohydrate diet divided over 5-6 small meals, increase water intake to 6-8 glasses, and 150 minutes per week of cardiovascular exercise.    Diabetes Mellitus and Exercise Exercising regularly is important for your overall health, especially when you have diabetes (diabetes mellitus). Exercising is not only about losing weight. It has many health benefits, such as increasing muscle strength and bone density and reducing body fat and stress. This leads to improved fitness, flexibility, and endurance, all of which result in better overall health. Exercise has additional benefits for people with diabetes, including:  Reducing appetite.  Helping to lower and control blood glucose.  Lowering blood pressure.  Helping to control amounts of fatty substances (lipids) in the blood, such as cholesterol and triglycerides.  Helping the body to respond better to insulin (improving insulin sensitivity).  Reducing how much insulin the body needs.  Decreasing the risk for heart disease by: ? Lowering cholesterol and triglyceride levels. ? Increasing the levels of good cholesterol. ? Lowering blood glucose levels.  What is my activity plan? Your health care provider or certified diabetes educator can help you make a plan for the type and frequency of exercise (activity plan) that works for you. Make sure that you:  Do at least 150 minutes of moderate-intensity or vigorous-intensity exercise each week. This could be brisk walking, biking, or water aerobics. ? Do stretching and strength exercises, such as yoga or weightlifting, at least 2 times a week. ? Spread out your activity over at least 3 days of the week.  Get some form of physical activity  every day. ? Do not go more than 2 days in a row without some kind of physical activity. ? Avoid being inactive for more than 90 minutes at a time. Take frequent breaks to walk or stretch.  Choose a type of exercise or activity that you enjoy, and set realistic goals.  Start slowly, and gradually increase the intensity of your exercise over time.  What do I need to know about managing my diabetes?  Check your blood glucose before and after exercising. ? If your blood glucose is higher than 240 mg/dL (16.113.3 mmol/L) before you exercise, check your urine for ketones. If you have ketones in your urine, do not exercise until your blood glucose returns to normal.  Know the symptoms of low blood glucose (hypoglycemia) and how to treat it. Your risk for hypoglycemia increases during and after exercise. Common symptoms of hypoglycemia can include: ? Hunger. ? Anxiety. ? Sweating and feeling clammy. ? Confusion. ? Dizziness or feeling light-headed. ? Increased heart rate or palpitations. ? Blurry vision. ? Tingling or numbness around the mouth, lips, or tongue. ? Tremors or shakes. ? Irritability.  Keep a rapid-acting carbohydrate snack available before, during, and after exercise to help prevent or treat hypoglycemia.  Avoid injecting insulin into areas of the body that are going to be exercised. For example, avoid injecting insulin into: ? The arms, when playing tennis. ? The legs, when jogging.  Keep records of your exercise habits. Doing this can help you and your health care provider adjust your diabetes management plan as needed. Write down: ? Food that you eat before and after you exercise. ? Blood glucose  levels before and after you exercise. ? The type and amount of exercise you have done. ? When your insulin is expected to peak, if you use insulin. Avoid exercising at times when your insulin is peaking.  When you start a new exercise or activity, work with your health care  provider to make sure the activity is safe for you, and to adjust your insulin, medicines, or food intake as needed.  Drink plenty of water while you exercise to prevent dehydration or heat stroke. Drink enough fluid to keep your urine clear or pale yellow. This information is not intended to replace advice given to you by your health care provider. Make sure you discuss any questions you have with your health care provider. Document Released: 04/19/2003 Document Revised: 08/17/2015 Document Reviewed: 07/09/2015 Elsevier Interactive Patient Education  2018 [employer].

## 2017-04-30 ENCOUNTER — Telehealth: Payer: Self-pay

## 2017-04-30 LAB — MICROALBUMIN / CREATININE URINE RATIO
Creatinine, Urine: 264.5 mg/dL
MICROALB/CREAT RATIO: 2.7 mg/g{creat} (ref 0.0–30.0)
MICROALBUM., U, RANDOM: 7.1 ug/mL

## 2017-04-30 LAB — BASIC METABOLIC PANEL
BUN/Creatinine Ratio: 12 (ref 9–20)
BUN: 12 mg/dL (ref 6–20)
CALCIUM: 9.5 mg/dL (ref 8.7–10.2)
CO2: 22 mmol/L (ref 20–29)
CREATININE: 1.04 mg/dL (ref 0.76–1.27)
Chloride: 105 mmol/L (ref 96–106)
GFR calc Af Amer: 106 mL/min/{1.73_m2} (ref >59)
GFR calc non Af Amer: 92 mL/min/{1.73_m2} (ref >59)
GLUCOSE: 93 mg/dL (ref 65–99)
Potassium: 3.8 mmol/L (ref 3.5–5.2)
Sodium: 144 mmol/L (ref 134–144)

## 2017-04-30 NOTE — Telephone Encounter (Signed)
-----   Message from Massie MaroonLachina M Hollis, [place] sent at 04/30/2017  1:29 PM EDT ----- Regarding: lab results Please inform patient that all labs are within normal range.  No medication changes warranted.  Continue to follow a carbohydrate modify diet as discussed during appointment.  We will follow-up in 6 months as scheduled.  Nolon NationsLachina Moore Hollis  MSN, FNP-C Patient Care [facility] [address]  [place], [place] [id number] [phone]

## 2017-04-30 NOTE — Telephone Encounter (Signed)
Called and advised that liver enzymes are normal range and no medication changes need to be made at this time. Patient verbalized understanding. Thanks!

## 2017-04-30 NOTE — Telephone Encounter (Signed)
Called and advised that labs

## 2017-05-05 ENCOUNTER — Other Ambulatory Visit: Payer: Self-pay | Admitting: Family Medicine

## 2017-05-05 DIAGNOSIS — J302 Other seasonal allergic rhinitis: Secondary | ICD-10-CM

## 2017-06-03 ENCOUNTER — Other Ambulatory Visit: Payer: Self-pay | Admitting: Family Medicine

## 2017-06-03 DIAGNOSIS — J302 Other seasonal allergic rhinitis: Secondary | ICD-10-CM

## 2017-07-01 ENCOUNTER — Other Ambulatory Visit: Payer: Self-pay | Admitting: Family Medicine

## 2017-07-01 DIAGNOSIS — J302 Other seasonal allergic rhinitis: Secondary | ICD-10-CM

## 2017-07-09 DIAGNOSIS — H3560 Retinal hemorrhage, unspecified eye: Secondary | ICD-10-CM | POA: Diagnosis not present

## 2017-07-09 DIAGNOSIS — H31003 Unspecified chorioretinal scars, bilateral: Secondary | ICD-10-CM | POA: Diagnosis not present

## 2017-07-09 DIAGNOSIS — H5213 Myopia, bilateral: Secondary | ICD-10-CM | POA: Diagnosis not present

## 2017-07-09 DIAGNOSIS — E119 Type 2 diabetes mellitus without complications: Secondary | ICD-10-CM | POA: Diagnosis not present

## 2017-07-16 ENCOUNTER — Encounter (INDEPENDENT_AMBULATORY_CARE_PROVIDER_SITE_OTHER): Payer: Self-pay | Admitting: Ophthalmology

## 2017-07-19 ENCOUNTER — Other Ambulatory Visit: Payer: Self-pay | Admitting: Family Medicine

## 2017-07-19 DIAGNOSIS — J302 Other seasonal allergic rhinitis: Secondary | ICD-10-CM

## 2017-08-10 ENCOUNTER — Encounter (INDEPENDENT_AMBULATORY_CARE_PROVIDER_SITE_OTHER): Payer: Self-pay | Admitting: Ophthalmology

## 2017-08-27 ENCOUNTER — Encounter (INDEPENDENT_AMBULATORY_CARE_PROVIDER_SITE_OTHER): Payer: Self-pay | Admitting: Ophthalmology

## 2017-10-14 ENCOUNTER — Other Ambulatory Visit: Payer: Self-pay | Admitting: Family Medicine

## 2017-10-14 DIAGNOSIS — J302 Other seasonal allergic rhinitis: Secondary | ICD-10-CM

## 2017-10-30 ENCOUNTER — Ambulatory Visit: Payer: BLUE CROSS/BLUE SHIELD | Admitting: Family Medicine

## 2017-11-12 ENCOUNTER — Telehealth: Payer: Self-pay

## 2017-11-12 ENCOUNTER — Other Ambulatory Visit: Payer: Self-pay | Admitting: Family Medicine

## 2017-11-12 DIAGNOSIS — J302 Other seasonal allergic rhinitis: Secondary | ICD-10-CM

## 2017-11-12 NOTE — Telephone Encounter (Signed)
Patient states that he will make appointment tomorrow at 2pm.

## 2017-11-13 ENCOUNTER — Ambulatory Visit (INDEPENDENT_AMBULATORY_CARE_PROVIDER_SITE_OTHER): Payer: BLUE CROSS/BLUE SHIELD | Admitting: Family Medicine

## 2017-11-13 ENCOUNTER — Encounter: Payer: Self-pay | Admitting: Family Medicine

## 2017-11-13 VITALS — BP 129/78 | HR 80 | Temp 98.6°F | Resp 16 | Ht 73.0 in | Wt 260.0 lb

## 2017-11-13 DIAGNOSIS — Z23 Encounter for immunization: Secondary | ICD-10-CM | POA: Diagnosis not present

## 2017-11-13 DIAGNOSIS — E119 Type 2 diabetes mellitus without complications: Secondary | ICD-10-CM

## 2017-11-13 DIAGNOSIS — R7303 Prediabetes: Secondary | ICD-10-CM

## 2017-11-13 LAB — POCT URINALYSIS DIPSTICK
Bilirubin, UA: NEGATIVE
Blood, UA: NEGATIVE
Glucose, UA: NEGATIVE
Ketones, UA: NEGATIVE
Leukocytes, UA: NEGATIVE
Nitrite, UA: NEGATIVE
Protein, UA: NEGATIVE
Spec Grav, UA: 1.025 (ref 1.010–1.025)
Urobilinogen, UA: 0.2 E.U./dL
pH, UA: 7 (ref 5.0–8.0)

## 2017-11-13 LAB — POCT GLYCOSYLATED HEMOGLOBIN (HGB A1C): Hemoglobin A1C: 6 % — AB (ref 4.0–5.6)

## 2017-11-13 MED ORDER — METFORMIN HCL 500 MG PO TABS: 500.0000 mg | ORAL_TABLET | Freq: Every day | ORAL | 1 refills | Status: DC

## 2017-11-13 NOTE — Progress Notes (Signed)
PATIENT CARE CENTER [facility]  DIABETES TYPE II FOLLOW UP VISIT   PROVIDER: Mike Gip, FNP   SUBJECTIVE:   37 y.o. male  Brian Medina is 37 y.o. male who  has a past medical history of Diabetes mellitus (HCC). here for follow-up of Type 2 diabetes mellitus.     Eye exam current (within one year): yes Weight trend: stable Prior visit with dietician: uninterested Current diet: in general, a "healthy" diet   Current exercise: no regular exercise Current monitoring regimen: none Home blood sugar records: doesnt check Any episodes of hypoglycemia? no Nicotine Abuse?  No Medication Compliance?  Yes  Last A1C: 6.0  Current Outpatient Medications  Medication Sig Dispense Refill  . fluticasone (FLONASE) 50 MCG/ACT nasal spray SHAKE LIQUID AND USE 2 SPRAYS IN EACH NOSTRIL DAILY 17 g 0  . levocetirizine (XYZAL) 5 MG tablet Take 1 tablet (5 mg total) by mouth every evening. 30 tablet 11  . metFORMIN (GLUCOPHAGE) 500 MG tablet Take 1 tablet (500 mg total) by mouth daily with breakfast. 90 tablet 1   No current facility-administered medications for this visit.    Review of Systems  Constitutional: Negative.   HENT: Negative.   Eyes: Negative.   Respiratory: Negative.   Cardiovascular: Negative.   Gastrointestinal: Negative.   Genitourinary: Negative.   Musculoskeletal: Negative.   Skin: Negative.   Neurological: Negative.   Psychiatric/Behavioral: Negative.       OBJECTIVE:  There were no vitals taken for this visit.  Lab Results  Component Value Date   HGBA1C 6.0 04/29/2017    Lab Results  Component Value Date   MICROALBUR 0.8 10/29/2015    Lab Results  Component Value Date   CHOL 155 10/30/2016   HDL 35 (L) 10/30/2016   LDLCALC 93 10/30/2016   TRIG 173 (H) 10/30/2016   CHOLHDL 4.4 10/30/2016     Physical Exam  Constitutional: He is oriented to person, place, and time and well-developed, well-nourished, and in no  distress. No distress.  HENT:  Head: Normocephalic and atraumatic.  Eyes: Pupils are equal, round, and reactive to light. Conjunctivae and EOM are normal.  Neck: Normal range of motion. Neck supple.  Cardiovascular: Normal rate, regular rhythm and intact distal pulses. Exam reveals no gallop and no friction rub.  No murmur heard. Pulmonary/Chest: Effort normal and breath sounds normal. No respiratory distress. He has no wheezes.  Abdominal: Soft. Bowel sounds are normal. There is no tenderness.  Musculoskeletal: Normal range of motion. He exhibits no edema or tenderness.  Lymphadenopathy:    He has no cervical adenopathy.  Neurological: He is alert and oriented to person, place, and time. Gait normal.  Skin: Skin is warm and dry.  Psychiatric: Mood, memory, affect and judgment normal.  Nursing note and vitals reviewed.    Diabetes Mellitus: well controlled  ASSESSMENT/PLAN: 1. Type 2 diabetes mellitus without complication, without long-term current use of insulin (HCC) The current medical regimen is effective;  continue present plan and medications.  - Comprehensive metabolic panel - Urinalysis Dipstick - HgB A1c - Lipid Panel With LDL/HDL Ratio - metFORMIN (GLUCOPHAGE) 500 MG tablet; Take 1 tablet (500 mg total) by mouth daily with breakfast.  Dispense: 90 tablet; Refill: 1   Issues reviewed with Brian Medina   Recommendations: 1.  Patient is counseled on appropriate foot care. 2.  BP goal < 130/80. 3.  LDL goal of < 100, HDL > 40 and TG < 150. 4.  Eye  Exam yearly and Dental Exam every 6 months. 5.  Dietary recommendations:  Carb modified/ Mediterranean Diet  6.  Physical Activity recommendations:  150 Minutes of aerobic activity weekly.  7.  Medication recommendations at this time are noted above.   The patient was given clear instructions to go to ER or return to medical center if symptoms do not improve, worsen or new problems develop. Return to the clinic for  scheduled follow up visits. Take medications as prescribed. The patient verbalized understanding and agreed with plan of care.    Ms. Freda Jackson. Riley Lam, FNP-BC [facility] [address] [place], [place] [address] [phone]

## 2017-11-13 NOTE — Patient Instructions (Addendum)
Diabetes Mellitus and Nutrition When you have diabetes (diabetes mellitus), it is very important to have healthy eating habits because your blood sugar (glucose) levels are greatly affected by what you eat and drink. Eating healthy foods in the appropriate amounts, at about the same times every day, can help you:  Control your blood glucose.  Lower your risk of heart disease.  Improve your blood pressure.  Reach or maintain a healthy weight.  Every person with diabetes is different, and each person has different needs for a meal plan. Your health care provider may recommend that you work with a diet and nutrition specialist (dietitian) to make a meal plan that is best for you. Your meal plan may vary depending on factors such as:  The calories you need.  The medicines you take.  Your weight.  Your blood glucose, blood pressure, and cholesterol levels.  Your activity level.  Other health conditions you have, such as heart or kidney disease.  How do carbohydrates affect me? Carbohydrates affect your blood glucose level more than any other type of food. Eating carbohydrates naturally increases the amount of glucose in your blood. Carbohydrate counting is a method for keeping track of how many carbohydrates you eat. Counting carbohydrates is important to keep your blood glucose at a healthy level, especially if you use insulin or take certain oral diabetes medicines. It is important to know how many carbohydrates you can safely have in each meal. This is different for every person. Your dietitian can help you calculate how many carbohydrates you should have at each meal and for snack. Foods that contain carbohydrates include:  Bread, cereal, rice, pasta, and crackers.  Potatoes and corn.  Peas, beans, and lentils.  Milk and yogurt.  Fruit and juice.  Desserts, such as cakes, cookies, ice cream, and candy.  How does alcohol affect me? Alcohol can cause a sudden decrease in blood  glucose (hypoglycemia), especially if you use insulin or take certain oral diabetes medicines. Hypoglycemia can be a life-threatening condition. Symptoms of hypoglycemia (sleepiness, dizziness, and confusion) are similar to symptoms of having too much alcohol. If your health care provider says that alcohol is safe for you, follow these guidelines:  Limit alcohol intake to no more than 1 drink per day for nonpregnant women and 2 drinks per day for men. One drink equals 12 oz of beer, 5 oz of wine, or 1 oz of hard liquor.  Do not drink on an empty stomach.  Keep yourself hydrated with water, diet soda, or unsweetened iced tea.  Keep in mind that regular soda, juice, and other mixers may contain a lot of sugar and must be counted as carbohydrates.  What are tips for following this plan? Reading food labels  Start by checking the serving size on the label. The amount of calories, carbohydrates, fats, and other nutrients listed on the label are based on one serving of the food. Many foods contain more than one serving per package.  Check the total grams (g) of carbohydrates in one serving. You can calculate the number of servings of carbohydrates in one serving by dividing the total carbohydrates by 15. For example, if a food has 30 g of total carbohydrates, it would be equal to 2 servings of carbohydrates.  Check the number of grams (g) of saturated and trans fats in one serving. Choose foods that have low or no amount of these fats.  Check the number of milligrams (mg) of sodium in one serving. Most people   should limit total sodium intake to less than 2,300 mg per day.  Always check the nutrition information of foods labeled as "low-fat" or "nonfat". These foods may be higher in added sugar or refined carbohydrates and should be avoided.  Talk to your dietitian to identify your daily goals for nutrients listed on the label. Shopping  Avoid buying canned, premade, or processed foods. These  foods tend to be high in fat, sodium, and added sugar.  Shop around the outside edge of the grocery store. This includes fresh fruits and vegetables, bulk grains, fresh meats, and fresh dairy. Cooking  Use low-heat cooking methods, such as baking, instead of high-heat cooking methods like deep frying.  Cook using healthy oils, such as olive, canola, or sunflower oil.  Avoid cooking with butter, cream, or high-fat meats. Meal planning  Eat meals and snacks regularly, preferably at the same times every day. Avoid going long periods of time without eating.  Eat foods high in fiber, such as fresh fruits, vegetables, beans, and whole grains. Talk to your dietitian about how many servings of carbohydrates you can eat at each meal.  Eat 4-6 ounces of lean protein each day, such as lean meat, chicken, fish, eggs, or tofu. 1 ounce is equal to 1 ounce of meat, chicken, or fish, 1 egg, or 1/4 cup of tofu.  Eat some foods each day that contain healthy fats, such as avocado, nuts, seeds, and fish. Lifestyle   Check your blood glucose regularly.  Exercise at least 30 minutes 5 or more days each week, or as told by your health care provider.  Take medicines as told by your health care provider.  Do not use any products that contain nicotine or tobacco, such as cigarettes and e-cigarettes. If you need help quitting, ask your health care provider.  Work with a Veterinary surgeon or diabetes educator to identify strategies to manage stress and any emotional and social challenges. What are some questions to ask my health care provider?  Do I need to meet with a diabetes educator?  Do I need to meet with a dietitian?  What number can I call if I have questions?  When are the best times to check my blood glucose? Where to find more information:  American Diabetes Association: diabetes.org/food-and-fitness/food  Academy of Nutrition and Dietetics:  https://www.vargas.com/  [employer] of Diabetes and Digestive and Kidney Diseases (NIH): FindJewelers.cz Summary  A healthy meal plan will help you control your blood glucose and maintain a healthy lifestyle.  Working with a diet and nutrition specialist (dietitian) can help you make a meal plan that is best for you.  Keep in mind that carbohydrates and alcohol have immediate effects on your blood glucose levels. It is important to count carbohydrates and to use alcohol carefully. This information is not intended to replace advice given to you by your health care provider. Make sure you discuss any questions you have with your health care provider. Document Released: 10/24/2004 Document Revised: 03/03/2016 Document Reviewed: 03/03/2016 Elsevier Interactive Patient Education  2018 [employer].   Calorie Counting for [employer] Calories are units of energy. Your body needs a certain amount of calories from food to keep you going throughout the day. When you eat more calories than your body needs, your body stores the extra calories as fat. When you eat fewer calories than your body needs, your body burns fat to get the energy it needs. Calorie counting means keeping track of how many calories you eat and  drink each day. Calorie counting can be helpful if you need to lose weight. If you make sure to eat fewer calories than your body needs, you should lose weight. Ask your health care provider what a healthy weight is for you. For calorie counting to work, you will need to eat the right number of calories in a day in order to lose a healthy amount of weight per week. A dietitian can help you determine how many calories you need in a day and will give you suggestions on how to reach your calorie goal.  A healthy amount of weight to lose per week is usually 1-2 lb (0.5-0.9 kg).  This usually means that your daily calorie intake should be reduced by 500-750 calories.  Eating 1,200 - 1,500 calories per day can help most women lose weight.  Eating 1,500 - 1,800 calories per day can help most men lose weight.  What is my plan? My goal is to have __________ calories per day. If I have this many calories per day, I should lose around __________ pounds per week. What do I need to know about calorie counting? In order to meet your daily calorie goal, you will need to:  Find out how many calories are in each food you would like to eat. Try to do this before you eat.  Decide how much of the food you plan to eat.  Write down what you ate and how many calories it had. Doing this is called keeping a food log.  To successfully lose weight, it is important to balance calorie counting with a healthy lifestyle that includes regular activity. Aim for 150 minutes of moderate exercise (such as walking) or 75 minutes of vigorous exercise (such as running) each week. Where do I find calorie information?  The number of calories in a food can be found on a Nutrition Facts label. If a food does not have a Nutrition Facts label, try to look up the calories online or ask your dietitian for help. Remember that calories are listed per serving. If you choose to have more than one serving of a food, you will have to multiply the calories per serving by the amount of servings you plan to eat. For example, the label on a package of bread might say that a serving size is 1 slice and that there are 90 calories in a serving. If you eat 1 slice, you will have eaten 90 calories. If you eat 2 slices, you will have eaten 180 calories. How do I keep a food log? Immediately after each meal, record the following information in your food log:  What you ate. Don't forget to include toppings, sauces, and other extras on the food.  How much you ate. This can be measured in cups, ounces, or number of  items.  How many calories each food and drink had.  The total number of calories in the meal.  Keep your food log near you, such as in a small notebook in your pocket, or use a mobile app or website. Some programs will calculate calories for you and show you how many calories you have left for the day to meet your goal. What are some calorie counting tips?  Use your calories on foods and drinks that will fill you up and not leave you hungry: ? Some examples of foods that fill you up are nuts and nut butters, vegetables, lean proteins, and high-fiber foods like whole grains. High-fiber foods are foods with  more than 5 g fiber per serving. ? Drinks such as sodas, specialty coffee drinks, alcohol, and juices have a lot of calories, yet do not fill you up.  Eat nutritious foods and avoid empty calories. Empty calories are calories you get from foods or beverages that do not have many vitamins or protein, such as candy, sweets, and soda. It is better to have a nutritious high-calorie food (such as an avocado) than a food with few nutrients (such as a bag of chips).  Know how many calories are in the foods you eat most often. This will help you calculate calorie counts faster.  Pay attention to calories in drinks. Low-calorie drinks include water and unsweetened drinks.  Pay attention to nutrition labels for "low fat" or "fat free" foods. These foods sometimes have the same amount of calories or more calories than the full fat versions. They also often have added sugar, starch, or salt, to make up for flavor that was removed with the fat.  Find a way of tracking calories that works for you. Get creative. Try different apps or programs if writing down calories does not work for you. What are some portion control tips?  Know how many calories are in a serving. This will help you know how many servings of a certain food you can have.  Use a measuring cup to measure serving sizes. You could also try  weighing out portions on a kitchen scale. With time, you will be able to estimate serving sizes for some foods.  Take some time to put servings of different foods on your favorite plates, bowls, and cups so you know what a serving looks like.  Try not to eat straight from a bag or box. Doing this can lead to overeating. Put the amount you would like to eat in a cup or on a plate to make sure you are eating the right portion.  Use smaller plates, glasses, and bowls to prevent overeating.  Try not to multitask (for example, watch TV or use your computer) while eating. If it is time to eat, sit down at a table and enjoy your food. This will help you to know when you are full. It will also help you to be aware of what you are eating and how much you are eating. What are tips for following this plan? Reading food labels  Check the calorie count compared to the serving size. The serving size may be smaller than what you are used to eating.  Check the source of the calories. Make sure the food you are eating is high in vitamins and protein and low in saturated and trans fats. Shopping  Read nutrition labels while you shop. This will help you make healthy decisions before you decide to purchase your food.  Make a grocery list and stick to it. Cooking  Try to cook your favorite foods in a healthier way. For example, try baking instead of frying.  Use low-fat dairy products. Meal planning  Use more fruits and vegetables. Half of your plate should be fruits and vegetables.  Include lean proteins like poultry and fish. How do I count calories when eating out?  Ask for smaller portion sizes.  Consider sharing an entree and sides instead of getting your own entree.  If you get your own entree, eat only half. Ask for a box at the beginning of your meal and put the rest of your entree in it so you are not tempted to eat  it.  If calories are listed on the menu, choose the lower calorie  options.  Choose dishes that include vegetables, fruits, whole grains, low-fat dairy products, and lean protein.  Choose items that are boiled, broiled, grilled, or steamed. Stay away from items that are buttered, battered, fried, or served with cream sauce. Items labeled "crispy" are usually fried, unless stated otherwise.  Choose water, low-fat milk, unsweetened iced tea, or other drinks without added sugar. If you want an alcoholic beverage, choose a lower calorie option such as a glass of wine or light beer.  Ask for dressings, sauces, and syrups on the side. These are usually high in calories, so you should limit the amount you eat.  If you want a salad, choose a garden salad and ask for grilled meats. Avoid extra toppings like bacon, cheese, or fried items. Ask for the dressing on the side, or ask for olive oil and vinegar or lemon to use as dressing.  Estimate how many servings of a food you are given. For example, a serving of cooked rice is  cup or about the size of half a baseball. Knowing serving sizes will help you be aware of how much food you are eating at restaurants. The list below tells you how big or small some common portion sizes are based on everyday objects: ? 1 oz-4 stacked dice. ? 3 oz-1 deck of cards. ? 1 tsp-1 die. ? 1 Tbsp- a ping-pong ball. ? 2 Tbsp-1 ping-pong ball. ?  cup- baseball. ? 1 cup-1 baseball. Summary  Calorie counting means keeping track of how many calories you eat and drink each day. If you eat fewer calories than your body needs, you should lose weight.  A healthy amount of weight to lose per week is usually 1-2 lb (0.5-0.9 kg). This usually means reducing your daily calorie intake by 500-750 calories.  The number of calories in a food can be found on a Nutrition Facts label. If a food does not have a Nutrition Facts label, try to look up the calories online or ask your dietitian for help.  Use your calories on foods and drinks that will fill  you up, and not on foods and drinks that will leave you hungry.  Use smaller plates, glasses, and bowls to prevent overeating. This information is not intended to replace advice given to you by your health care provider. Make sure you discuss any questions you have with your health care provider. Document Released: 01/27/2005 Document Revised: 12/28/2015 Document Reviewed: 12/28/2015 Elsevier Interactive Patient Education  [employer].

## 2017-11-14 LAB — COMPREHENSIVE METABOLIC PANEL
ALT: 31 IU/L (ref 0–44)
AST: 20 IU/L (ref 0–40)
Albumin/Globulin Ratio: 1.5 (ref 1.2–2.2)
Albumin: 4.5 g/dL (ref 3.5–5.5)
Alkaline Phosphatase: 107 IU/L (ref 39–117)
BUN/Creatinine Ratio: 16 (ref 9–20)
BUN: 17 mg/dL (ref 6–20)
Bilirubin Total: 0.3 mg/dL (ref 0.0–1.2)
CO2: 22 mmol/L (ref 20–29)
Calcium: 9.7 mg/dL (ref 8.7–10.2)
Chloride: 102 mmol/L (ref 96–106)
Creatinine, Ser: 1.06 mg/dL (ref 0.76–1.27)
GFR calc Af Amer: 103 mL/min/{1.73_m2} (ref >59)
GFR calc non Af Amer: 89 mL/min/{1.73_m2} (ref >59)
Globulin, Total: 3.1 g/dL (ref 1.5–4.5)
Glucose: 83 mg/dL (ref 65–99)
Potassium: 4.2 mmol/L (ref 3.5–5.2)
Sodium: 143 mmol/L (ref 134–144)
Total Protein: 7.6 g/dL (ref 6.0–8.5)

## 2017-11-14 LAB — LIPID PANEL WITH LDL/HDL RATIO
Cholesterol, Total: 152 mg/dL (ref 100–199)
HDL: 39 mg/dL — ABNORMAL LOW (ref >39)
LDL Calculated: 103 mg/dL — ABNORMAL HIGH (ref 0–99)
LDl/HDL Ratio: 2.6 ratio (ref 0.0–3.6)
Triglycerides: 50 mg/dL (ref 0–149)
VLDL Cholesterol Cal: 10 mg/dL (ref 5–40)

## 2017-11-19 ENCOUNTER — Encounter: Payer: Self-pay | Admitting: Family Medicine

## 2017-11-19 NOTE — Progress Notes (Signed)
Please print this lab letter and send to [employer]

## 2017-11-26 ENCOUNTER — Other Ambulatory Visit: Payer: Self-pay | Admitting: Family Medicine

## 2017-11-26 DIAGNOSIS — E119 Type 2 diabetes mellitus without complications: Secondary | ICD-10-CM

## 2017-12-08 ENCOUNTER — Other Ambulatory Visit: Payer: Self-pay | Admitting: Family Medicine

## 2017-12-08 DIAGNOSIS — J302 Other seasonal allergic rhinitis: Secondary | ICD-10-CM

## 2018-01-06 ENCOUNTER — Other Ambulatory Visit: Payer: Self-pay | Admitting: Family Medicine

## 2018-01-06 DIAGNOSIS — J302 Other seasonal allergic rhinitis: Secondary | ICD-10-CM

## 2018-02-04 ENCOUNTER — Other Ambulatory Visit: Payer: Self-pay | Admitting: Family Medicine

## 2018-02-04 DIAGNOSIS — J302 Other seasonal allergic rhinitis: Secondary | ICD-10-CM

## 2018-04-24 ENCOUNTER — Other Ambulatory Visit: Payer: Self-pay | Admitting: Family Medicine

## 2018-04-24 DIAGNOSIS — E119 Type 2 diabetes mellitus without complications: Secondary | ICD-10-CM

## 2018-05-17 ENCOUNTER — Encounter: Payer: Self-pay | Admitting: Family Medicine

## 2018-05-17 ENCOUNTER — Other Ambulatory Visit: Payer: Self-pay

## 2018-05-17 ENCOUNTER — Ambulatory Visit (INDEPENDENT_AMBULATORY_CARE_PROVIDER_SITE_OTHER): Payer: BLUE CROSS/BLUE SHIELD | Admitting: Family Medicine

## 2018-05-17 DIAGNOSIS — R7303 Prediabetes: Secondary | ICD-10-CM

## 2018-05-17 DIAGNOSIS — E119 Type 2 diabetes mellitus without complications: Secondary | ICD-10-CM

## 2018-05-17 MED ORDER — METFORMIN HCL 500 MG PO TABS: 500.0000 mg | ORAL_TABLET | Freq: Every day | ORAL | 2 refills | Status: DC

## 2018-05-17 NOTE — Progress Notes (Signed)
  [facility]  Virtual Visit via Telephone Note  I connected with Brian Medina on 05/17/18 at  2:00 PM EDT by telephone and verified that I am speaking with the correct person using two identifiers.   I discussed the limitations, risks, security and privacy concerns of performing an evaluation and management service by telephone and the availability of in person appointments. I also discussed with the patient that there may be a patient responsible charge related to this service. The patient expressed understanding and agreed to proceed.   History of Present Illness: Brian Medina  has a past medical history of Diabetes mellitus (HCC). He states that he is compliant with medication, diet and exercise. He denies side effects of medications. He is doing well without complaint. Last A1C was 6.0. Cholesterol, Total 100 - 199 mg/dL 250    Triglycerides 0 - 149 mg/dL 50    HDL >53 mg/dL [id number]     VLDL Cholesterol Cal 5 - 40 mg/dL 10    LDL Calculated 0 - 99 mg/dL [id number]     LDl/HDL Ratio 0.0 - 3.6 ratio 2.6         Observations/Objective: Patient with regular voice tone, rate and rhythm. Speaking calmly and is in no apparent distress.   Assessment and Plan: 1. Type 2 diabetes mellitus without complication, without long-term current use of insulin (HCC) The patient is asked to make an attempt to improve diet and exercise patterns to aid in medical management of this problem.  - metFORMIN (GLUCOPHAGE) 500 MG tablet; Take 1 tablet (500 mg total) by mouth daily with breakfast.  Dispense: 90 tablet; Refill: 2   Follow Up Instructions: We discussed hand washing, using hand sanitizer when soap and water are not available, only going out when absolutely necessary, and social distancing. Explained to patient that he is immunocompromised and will need to take precautions during this time.     I discussed the assessment and treatment plan with  the patient. The patient was provided an opportunity to ask questions and all were answered. The patient agreed with the plan and demonstrated an understanding of the instructions.   The patient was advised to call back or seek an in-person evaluation if the symptoms worsen or if the condition fails to improve as anticipated.  I provided 15 minutes of non-face-to-face time during this encounter.  Ms. Andr L. Riley Lam, FNP-BC [facility] [address] [place], [place] [address] [phone]

## 2018-05-28 ENCOUNTER — Other Ambulatory Visit: Payer: Self-pay | Admitting: Family Medicine

## 2018-05-28 DIAGNOSIS — J302 Other seasonal allergic rhinitis: Secondary | ICD-10-CM

## 2018-07-13 DIAGNOSIS — H31003 Unspecified chorioretinal scars, bilateral: Secondary | ICD-10-CM | POA: Diagnosis not present

## 2018-07-13 DIAGNOSIS — E119 Type 2 diabetes mellitus without complications: Secondary | ICD-10-CM | POA: Diagnosis not present

## 2018-07-13 DIAGNOSIS — H35413 Lattice degeneration of retina, bilateral: Secondary | ICD-10-CM | POA: Diagnosis not present

## 2018-07-13 DIAGNOSIS — H33321 Round hole, right eye: Secondary | ICD-10-CM | POA: Diagnosis not present

## 2018-07-14 ENCOUNTER — Encounter (HOSPITAL_COMMUNITY): Payer: Self-pay | Admitting: Optometry

## 2018-07-28 ENCOUNTER — Encounter (INDEPENDENT_AMBULATORY_CARE_PROVIDER_SITE_OTHER): Payer: BC Managed Care – PPO | Admitting: Ophthalmology

## 2018-07-28 ENCOUNTER — Other Ambulatory Visit: Payer: Self-pay

## 2018-07-28 DIAGNOSIS — H33303 Unspecified retinal break, bilateral: Secondary | ICD-10-CM

## 2018-07-28 DIAGNOSIS — H43813 Vitreous degeneration, bilateral: Secondary | ICD-10-CM

## 2018-07-28 DIAGNOSIS — H35413 Lattice degeneration of retina, bilateral: Secondary | ICD-10-CM | POA: Diagnosis not present

## 2018-08-04 ENCOUNTER — Other Ambulatory Visit: Payer: Self-pay

## 2018-08-04 ENCOUNTER — Encounter (INDEPENDENT_AMBULATORY_CARE_PROVIDER_SITE_OTHER): Payer: BC Managed Care – PPO | Admitting: Ophthalmology

## 2018-08-04 DIAGNOSIS — H33302 Unspecified retinal break, left eye: Secondary | ICD-10-CM | POA: Diagnosis not present

## 2018-08-12 ENCOUNTER — Other Ambulatory Visit: Payer: Self-pay

## 2018-08-12 ENCOUNTER — Encounter (INDEPENDENT_AMBULATORY_CARE_PROVIDER_SITE_OTHER): Payer: BC Managed Care – PPO | Admitting: Ophthalmology

## 2018-08-12 DIAGNOSIS — H33303 Unspecified retinal break, bilateral: Secondary | ICD-10-CM

## 2018-08-18 ENCOUNTER — Ambulatory Visit: Payer: BLUE CROSS/BLUE SHIELD | Admitting: Family Medicine

## 2018-09-09 ENCOUNTER — Encounter: Payer: Self-pay | Admitting: Family Medicine

## 2018-09-09 ENCOUNTER — Other Ambulatory Visit: Payer: Self-pay

## 2018-09-09 ENCOUNTER — Ambulatory Visit (INDEPENDENT_AMBULATORY_CARE_PROVIDER_SITE_OTHER): Payer: BC Managed Care – PPO | Admitting: Family Medicine

## 2018-09-09 VITALS — BP 128/86 | HR 108 | Temp 98.8°F | Resp 16 | Ht 73.0 in | Wt 263.0 lb

## 2018-09-09 DIAGNOSIS — E119 Type 2 diabetes mellitus without complications: Secondary | ICD-10-CM | POA: Diagnosis not present

## 2018-09-09 LAB — POCT GLYCOSYLATED HEMOGLOBIN (HGB A1C): Hemoglobin A1C: 6.1 % — AB (ref 4.0–5.6)

## 2018-09-09 NOTE — Patient Instructions (Signed)
Diabetes Mellitus and Standards of Medical Care Managing diabetes (diabetes mellitus) can be complicated. Your diabetes treatment may be managed by a team of health care providers, including:  A physician who specializes in diabetes (endocrinologist).  A nurse practitioner or physician assistant.  Nurses.  A diet and nutrition specialist (registered dietitian).  A certified diabetes educator (CDE).  An exercise specialist.  A pharmacist.  An eye doctor.  A foot specialist (podiatrist).  A dentist.  A primary care provider.  A mental health provider. Your health care providers follow guidelines to help you get the best quality of care. The following schedule is a general guideline for your diabetes management plan. Your health care providers may give you more specific instructions. Physical exams Upon being diagnosed with diabetes mellitus, and each year after that, your health care provider will ask about your medical and family history. He or she will also do a physical exam. Your exam may include:  Measuring your height, weight, and body mass index (BMI).  Checking your blood pressure. This will be done at every routine medical visit. Your target blood pressure may vary depending on your medical conditions, your age, and other factors.  Thyroid gland exam.  Skin exam.  Screening for damage to your nerves (peripheral neuropathy). This may include checking the pulse in your legs and feet and checking the level of sensation in your hands and feet.  A complete foot exam to inspect the structure and skin of your feet, including checking for cuts, bruises, redness, blisters, sores, or other problems.  Screening for blood vessel (vascular) problems, which may include checking the pulse in your legs and feet and checking your temperature. Blood tests Depending on your treatment plan and your personal needs, you may have the following tests done:  HbA1c (hemoglobin A1c). This  test provides information about blood sugar (glucose) control over the previous 2-3 months. It is used to adjust your treatment plan, if needed. This test will be done: ? At least 2 times a year, if you are meeting your treatment goals. ? 4 times a year, if you are not meeting your treatment goals or if treatment goals have changed.  Lipid testing, including total, LDL, and HDL cholesterol and triglyceride levels. ? The goal for LDL is less than 100 mg/dL (5.5 mmol/L). If you are at high risk for complications, the goal is less than 70 mg/dL (3.9 mmol/L). ? The goal for HDL is 40 mg/dL (2.2 mmol/L) or higher for men and 50 mg/dL (2.8 mmol/L) or higher for women. An HDL cholesterol of 60 mg/dL (3.3 mmol/L) or higher gives some protection against heart disease. ? The goal for triglycerides is less than 150 mg/dL (8.3 mmol/L).  Liver function tests.  Kidney function tests.  Thyroid function tests. Dental and eye exams  Visit your dentist two times a year.  If you have type 1 diabetes, your health care provider may recommend an eye exam 3-5 years after you are diagnosed, and then once a year after your first exam. ? For children with type 1 diabetes, a health care provider may recommend an eye exam when your child is age 10 or older and has had diabetes for 3-5 years. After the first exam, your child should get an eye exam once a year.  If you have type 2 diabetes, your health care provider may recommend an eye exam as soon as you are diagnosed, and then once a year after your first exam. Immunizations   The   yearly flu (influenza) vaccine is recommended for everyone 6 months or older who has diabetes.  The pneumonia (pneumococcal) vaccine is recommended for everyone 2 years or older who has diabetes. If you are 65 or older, you may get the pneumonia vaccine as a series of two separate shots.  The hepatitis B vaccine is recommended for adults shortly after being diagnosed with diabetes.   Adults and children with diabetes should receive all other vaccines according to age-specific recommendations from the Centers for Disease Control and Prevention (CDC). Mental and emotional health Screening for symptoms of eating disorders, anxiety, and depression is recommended at the time of diagnosis and afterward as needed. If your screening shows that you have symptoms (positive screening result), you may need more evaluation and you may work with a mental health care provider. Treatment plan Your treatment plan will be reviewed at every medical visit. You and your health care provider will discuss:  How you are taking your medicines, including insulin.  Any side effects you are experiencing.  Your blood glucose target goals.  The frequency of your blood glucose monitoring.  Lifestyle habits, such as activity level as well as tobacco, alcohol, and substance use. Diabetes self-management education Your health care provider will assess how well you are monitoring your blood glucose levels and whether you are taking your insulin correctly. He or she may refer you to:  A certified diabetes educator to manage your diabetes throughout your life, starting at diagnosis.  A registered dietitian who can create or review your personal nutrition plan.  An exercise specialist who can discuss your activity level and exercise plan. Summary  Managing diabetes (diabetes mellitus) can be complicated. Your diabetes treatment may be managed by a team of health care providers.  Your health care providers follow guidelines in order to help you get the best quality of care.  Standards of care including having regular physical exams, blood tests, blood pressure monitoring, immunizations, screening tests, and education about how to manage your diabetes.  Your health care providers may also give you more specific instructions based on your individual health. This information is not intended to replace  advice given to you by your health care provider. Make sure you discuss any questions you have with your health care provider. Document Released: 11/24/2008 Document Revised: 10/16/2017 Document Reviewed: 10/26/2015 Elsevier Patient Education  2020 Elsevier Inc.  

## 2018-09-09 NOTE — Progress Notes (Signed)
  [name]   Progress Note: General Provider: Lanae Boast, FNP  SUBJECTIVE:   Brian Medina is a 38 y.o. male who  has a past medical history of Diabetes mellitus ([place]).. Patient presents today for Diabetes Patient states that he has been trying to lose weight through diet and exercise. Patient reports compliance with all medications.  Patient denies side effects of medications.    Review of Systems  Constitutional: Negative.   HENT: Negative.   Eyes: Negative.   Respiratory: Negative.   Cardiovascular: Negative.   Gastrointestinal: Negative.   Genitourinary: Negative.   Musculoskeletal: Negative.   Skin: Negative.   Neurological: Negative.   Psychiatric/Behavioral: Negative.      OBJECTIVE: BP 128/86 (BP Location: Left Arm, Patient Position: Sitting, Cuff Size: Large)   Pulse (!) 108   Temp 98.8 F (37.1 C) (Oral)   Resp 16   Ht 6\' 1"  (1.854 m)   Wt 263 lb (119.3 kg)   SpO2 98%   BMI 34.70 kg/m   Wt Readings from Last 3 Encounters:  09/09/18 263 lb (119.3 kg)  11/13/17 260 lb (117.9 kg)  04/29/17 260 lb (117.9 kg)     Physical Exam Vitals signs and nursing note reviewed.  Constitutional:      General: He is not in acute distress.    Appearance: Normal appearance.  HENT:     Head: Normocephalic and atraumatic.  Eyes:     Extraocular Movements: Extraocular movements intact.     Conjunctiva/sclera: Conjunctivae normal.     Pupils: Pupils are equal, round, and reactive to light.  Cardiovascular:     Rate and Rhythm: Normal rate and regular rhythm.     Heart sounds: No murmur.  Pulmonary:     Effort: Pulmonary effort is normal.     Breath sounds: Normal breath sounds.  Musculoskeletal: Normal range of motion.  Skin:    General: Skin is warm and dry.  Neurological:     Mental Status: He is alert and oriented to person, place, and time.  Psychiatric:        Mood and Affect: Mood normal.      Behavior: Behavior normal.        Thought Content: Thought content normal.        Judgment: Judgment normal.     ASSESSMENT/PLAN:   1. Type 2 diabetes mellitus without complication, without long-term current use of insulin (HCC) No medication changes warranted at the present time.  The patient is asked to make an attempt to improve diet and exercise patterns to aid in medical management of this problem.  - HgB A1c- 6.1 and still at goal.      Return in about 6 months (around 03/12/2019) for DM2.    The patient was given clear instructions to go to ER or return to medical center if symptoms do not improve, worsen or new problems develop. The patient verbalized understanding and agreed with plan of care.   Ms. Doug Sou. Nathaneil Canary, FNP-BC [facility] [address] [place], [place] [address] [phone]

## 2018-09-28 ENCOUNTER — Other Ambulatory Visit: Payer: Self-pay | Admitting: Family Medicine

## 2018-09-28 DIAGNOSIS — J302 Other seasonal allergic rhinitis: Secondary | ICD-10-CM

## 2018-10-20 ENCOUNTER — Encounter (HOSPITAL_COMMUNITY): Payer: Self-pay

## 2018-10-20 ENCOUNTER — Encounter (HOSPITAL_COMMUNITY): Payer: Self-pay | Admitting: *Deleted

## 2018-12-10 ENCOUNTER — Ambulatory Visit (INDEPENDENT_AMBULATORY_CARE_PROVIDER_SITE_OTHER): Payer: BC Managed Care – PPO

## 2018-12-10 ENCOUNTER — Other Ambulatory Visit: Payer: Self-pay

## 2018-12-10 DIAGNOSIS — Z23 Encounter for immunization: Secondary | ICD-10-CM | POA: Diagnosis not present

## 2018-12-15 ENCOUNTER — Encounter (INDEPENDENT_AMBULATORY_CARE_PROVIDER_SITE_OTHER): Payer: BC Managed Care – PPO | Admitting: Ophthalmology

## 2018-12-20 ENCOUNTER — Encounter (INDEPENDENT_AMBULATORY_CARE_PROVIDER_SITE_OTHER): Payer: BC Managed Care – PPO | Admitting: Ophthalmology

## 2018-12-20 DIAGNOSIS — H33303 Unspecified retinal break, bilateral: Secondary | ICD-10-CM

## 2018-12-20 DIAGNOSIS — H2512 Age-related nuclear cataract, left eye: Secondary | ICD-10-CM

## 2018-12-20 DIAGNOSIS — H43813 Vitreous degeneration, bilateral: Secondary | ICD-10-CM

## 2019-01-29 ENCOUNTER — Other Ambulatory Visit: Payer: Self-pay | Admitting: Family Medicine

## 2019-01-29 DIAGNOSIS — J302 Other seasonal allergic rhinitis: Secondary | ICD-10-CM

## 2019-02-28 ENCOUNTER — Other Ambulatory Visit: Payer: Self-pay | Admitting: Family Medicine

## 2019-02-28 DIAGNOSIS — E119 Type 2 diabetes mellitus without complications: Secondary | ICD-10-CM

## 2019-03-14 ENCOUNTER — Ambulatory Visit: Payer: Managed Care, Other (non HMO) | Admitting: Family Medicine

## 2019-03-15 ENCOUNTER — Other Ambulatory Visit: Payer: Self-pay

## 2019-03-15 ENCOUNTER — Encounter: Payer: Self-pay | Admitting: Family Medicine

## 2019-03-15 ENCOUNTER — Ambulatory Visit (INDEPENDENT_AMBULATORY_CARE_PROVIDER_SITE_OTHER): Payer: BC Managed Care – PPO | Admitting: Family Medicine

## 2019-03-15 VITALS — BP 130/78 | HR 92 | Temp 98.1°F | Resp 16 | Ht 73.0 in | Wt 269.0 lb

## 2019-03-15 DIAGNOSIS — E119 Type 2 diabetes mellitus without complications: Secondary | ICD-10-CM

## 2019-03-15 DIAGNOSIS — E669 Obesity, unspecified: Secondary | ICD-10-CM | POA: Diagnosis not present

## 2019-03-15 LAB — POCT URINALYSIS DIPSTICK
Bilirubin, UA: NEGATIVE
Blood, UA: NEGATIVE
Glucose, UA: NEGATIVE
Ketones, UA: NEGATIVE
Leukocytes, UA: NEGATIVE
Nitrite, UA: NEGATIVE
Protein, UA: NEGATIVE
Spec Grav, UA: 1.025 (ref 1.010–1.025)
Urobilinogen, UA: 1 E.U./dL
pH, UA: 7.5 (ref 5.0–8.0)

## 2019-03-15 LAB — POCT GLYCOSYLATED HEMOGLOBIN (HGB A1C): Hemoglobin A1C: 5.8 % — AB (ref 4.0–5.6)

## 2019-03-15 MED ORDER — METFORMIN HCL 500 MG PO TABS: 500.0000 mg | ORAL_TABLET | Freq: Every day | ORAL | 1 refills | Status: DC

## 2019-03-15 NOTE — Progress Notes (Signed)
[facility]   Established Patient Office Visit  Subjective:  Patient ID: Brian Medina, male    DOB: 1981-01-03  Age: 39 y.o. MRN: [id number]  CC:  Chief Complaint  Patient presents with  . Diabetes   Brian Medina, is a very pleasant 39 year old male with a medical history significant for type 2 diabetes mellitus and obesity presents for follow-up.  Patient states that he has been doing well and is without complaint on today.  He has been taking Metformin consistently.  He has not been exercising or following a carbohydrate diet routinely.  Patient says that he has not been as active, he has been isolating due to COVID-19 pandemic  Diabetes His disease course has been stable. There are no hypoglycemic associated symptoms. Pertinent negatives for diabetes include no blurred vision, no fatigue, no foot ulcerations, no polydipsia, no polyphagia and no polyuria. Risk factors for coronary artery disease include diabetes mellitus, male sex and obesity. Current diabetic treatment includes oral agent (monotherapy). He is compliant with treatment all of the time. He participates in exercise intermittently. An ACE inhibitor/angiotensin II receptor blocker is not being taken. He does not see a podiatrist.Eye exam is not current.     Past Medical History:  Diagnosis Date  . Diabetes mellitus ([place])     Past Surgical History:  Procedure Laterality Date  . TONSILLECTOMY      Family History  Problem Relation Age of Onset  . Diabetes Father   . Diabetes Paternal Grandfather     Social History   Socioeconomic History  . Marital status: Single    Spouse name: Not on file  . Number of children: Not on file  . Years of education: Not on file  . Highest education level: Not on file  Occupational History  . Not on file  Tobacco Use  . Smoking status: Never Smoker  . Smokeless tobacco: Never Used  Substance and Sexual Activity  . Alcohol  use: Yes    Comment: occ  . Drug use: No  . Sexual activity: Yes    Birth control/protection: Condom  Other Topics Concern  . Not on file  Social History Narrative  . Not on file   Social Determinants of Health   Financial Resource Strain:   . Difficulty of Paying Living Expenses: Not on file  Food Insecurity:   . Worried About Charity fundraiser in the Last Year: Not on file  . Ran Out of Food in the Last Year: Not on file  Transportation Needs:   . Lack of Transportation (Medical): Not on file  . Lack of Transportation (Non-Medical): Not on file  Physical Activity:   . Days of Exercise per Week: Not on file  . Minutes of Exercise per Session: Not on file  Stress:   . Feeling of Stress : Not on file  Social Connections:   . Frequency of Communication with Friends and Family: Not on file  . Frequency of Social Gatherings with Friends and Family: Not on file  . Attends Religious Services: Not on file  . Active Member of Clubs or Organizations: Not on file  . Attends Archivist Meetings: Not on file  . Marital Status: Not on file  Intimate Partner Violence:   . Fear of Current or Ex-Partner: Not on file  . Emotionally Abused: Not on file  . Physically Abused: Not on file  . Sexually Abused: Not on file    Outpatient  Medications Prior to Visit  Medication Sig Dispense Refill  . fluticasone (FLONASE) 50 MCG/ACT nasal spray SHAKE LIQUID AND USE 2 SPRAYS IN EACH NOSTRIL DAILY 16 g 3  . metFORMIN (GLUCOPHAGE) 500 MG tablet TAKE 1 TABLET BY MOUTH DAILY WITH BREAKFAST 90 tablet 0   No facility-administered medications prior to visit.    Allergies  Allergen Reactions  . Other Anaphylaxis    Mushrooms  . Mushroom Extract Complex     ROS Review of Systems  Constitutional: Negative for fatigue and fever.  HENT: Negative.   Eyes: Negative.  Negative for blurred vision.  Respiratory: Negative.   Cardiovascular: Negative.   Endocrine: Negative for polydipsia,  polyphagia and polyuria.  Genitourinary: Negative.   Neurological: Negative.   Hematological: Negative.       Objective:    Physical Exam  Constitutional: He is oriented to person, place, and time. He appears well-developed.  HENT:  Head: Normocephalic and atraumatic.  Eyes: Pupils are equal, round, and reactive to light.  Cardiovascular: Normal rate, regular rhythm and normal heart sounds.  Pulmonary/Chest: Effort normal and breath sounds normal.  Musculoskeletal:        General: Normal range of motion.     Cervical back: Normal range of motion.  Neurological: He is alert and oriented to person, place, and time.  Skin: Skin is warm and dry.  Psychiatric: He has a normal mood and affect. His behavior is normal. Judgment and thought content normal.    BP 130/78 (BP Location: Left Arm, Patient Position: Sitting, Cuff Size: Normal)   Pulse 92   Temp 98.1 F (36.7 C) (Oral)   Resp 16   Ht 6\' 1"  (1.854 m)   Wt 269 lb (122 kg)   SpO2 98%   BMI 35.49 kg/m  Wt Readings from Last 3 Encounters:  03/15/19 269 lb (122 kg)  09/09/18 263 lb (119.3 kg)  11/13/17 260 lb (117.9 kg)     There are no preventive care reminders to display for this patient.  There are no preventive care reminders to display for this patient.  Lab Results  Component Value Date   TSH 1.00 10/30/2016   Lab Results  Component Value Date   WBC 5.6 10/29/2015   HGB 13.6 10/29/2015   HCT 41.2 10/29/2015   MCV 88.2 10/29/2015   PLT 229 10/29/2015   Lab Results  Component Value Date   NA 143 11/13/2017   K 4.2 11/13/2017   CO2 22 11/13/2017   GLUCOSE 83 11/13/2017   BUN 17 11/13/2017   CREATININE 1.06 11/13/2017   BILITOT 0.3 11/13/2017   ALKPHOS 107 11/13/2017   AST 20 11/13/2017   ALT 31 11/13/2017   PROT 7.6 11/13/2017   ALBUMIN 4.5 11/13/2017   CALCIUM 9.7 11/13/2017   ANIONGAP 13 12/14/2014   Lab Results  Component Value Date   CHOL 152 11/13/2017   Lab Results  Component Value  Date   HDL 39 (L) 11/13/2017   Lab Results  Component Value Date   LDLCALC 103 (H) 11/13/2017   Lab Results  Component Value Date   TRIG 50 11/13/2017   Lab Results  Component Value Date   CHOLHDL 4.4 10/30/2016   Lab Results  Component Value Date   HGBA1C 5.8 (A) 03/15/2019      Assessment & Plan:   Problem List Items Addressed This Visit      Endocrine   Diabetes mellitus (HCC) - Primary   Relevant Medications   metFORMIN (GLUCOPHAGE)  500 MG tablet   Other Relevant Orders   HgB A1c (Completed)   Urinalysis Dipstick (Completed)   Basic Metabolic Panel    Other Visit Diagnoses    Obesity (BMI 30-39.9)       Relevant Medications   metFORMIN (GLUCOPHAGE) 500 MG tablet      Meds ordered this encounter  Medications  . metFORMIN (GLUCOPHAGE) 500 MG tablet    Sig: Take 1 tablet (500 mg total) by mouth daily with breakfast.    Dispense:  90 tablet    Refill:  1    Order Specific Question:   Supervising Provider    Answer:   Quentin Angst [[id number]]   Type 2 diabetes mellitus without complication, without long-term current use of insulin (HCC) Hemoglobin a1C has improved to 5.8, no medication changes warranted on today. Discussed diet and exercise regimen at length.  - HgB A1c - Urinalysis Dipstick - Basic Metabolic Panel - metFORMIN (GLUCOPHAGE) 500 MG tablet; Take 1 tablet (500 mg total) by mouth daily with breakfast.  Dispense: 90 tablet; Refill: 1  2. Obesity (BMI 30-39.9) Last Weight  Most recent update: 03/15/2019  2:09 PM   Weight  122 kg (269 lb)          Body mass index is 35.49 kg/m. The patient is asked to make an attempt to improve diet and exercise patterns to aid in medical management of this problem.   Follow-up: Return in about 6 months (around 09/12/2019) for diabetes.    Nolon Nations  APRN, MSN, FNP-C Patient Care [facility] [address] [place], [place] [address] [phone]

## 2019-03-15 NOTE — Patient Instructions (Signed)
Diabetes Mellitus and Exercise Exercising regularly is important for your overall health, especially when you have diabetes (diabetes mellitus). Exercising is not only about losing weight. It has many other health benefits, such as increasing muscle strength and bone density and reducing body fat and stress. This leads to improved fitness, flexibility, and endurance, all of which result in better overall health. Exercise has additional benefits for people with diabetes, including:  Reducing appetite.  Helping to lower and control blood glucose.  Lowering blood pressure.  Helping to control amounts of fatty substances (lipids) in the blood, such as cholesterol and triglycerides.  Helping the body to respond better to insulin (improving insulin sensitivity).  Reducing how much insulin the body needs.  Decreasing the risk for heart disease by: ? Lowering cholesterol and triglyceride levels. ? Increasing the levels of good cholesterol. ? Lowering blood glucose levels. What is my activity plan? Your health care provider or certified diabetes educator can help you make a plan for the type and frequency of exercise (activity plan) that works for you. Make sure that you:  Do at least 150 minutes of moderate-intensity or vigorous-intensity exercise each week. This could be brisk walking, biking, or water aerobics. ? Do stretching and strength exercises, such as yoga or weightlifting, at least 2 times a week. ? Spread out your activity over at least 3 days of the week.  Get some form of physical activity every day. ? Do not go more than 2 days in a row without some kind of physical activity. ? Avoid being inactive for more than 30 minutes at a time. Take frequent breaks to walk or stretch.  Choose a type of exercise or activity that you enjoy, and set realistic goals.  Start slowly, and gradually increase the intensity of your exercise over time. What do I need to know about managing my  diabetes?   Check your blood glucose before and after exercising. ? If your blood glucose is 240 mg/dL (13.3 mmol/L) or higher before you exercise, check your urine for ketones. If you have ketones in your urine, do not exercise until your blood glucose returns to normal. ? If your blood glucose is 100 mg/dL (5.6 mmol/L) or lower, eat a snack containing 15-20 grams of carbohydrate. Check your blood glucose 15 minutes after the snack to make sure that your level is above 100 mg/dL (5.6 mmol/L) before you start your exercise.  Know the symptoms of low blood glucose (hypoglycemia) and how to treat it. Your risk for hypoglycemia increases during and after exercise. Common symptoms of hypoglycemia can include: ? Hunger. ? Anxiety. ? Sweating and feeling clammy. ? Confusion. ? Dizziness or feeling light-headed. ? Increased heart rate or palpitations. ? Blurry vision. ? Tingling or numbness around the mouth, lips, or tongue. ? Tremors or shakes. ? Irritability.  Keep a rapid-acting carbohydrate snack available before, during, and after exercise to help prevent or treat hypoglycemia.  Avoid injecting insulin into areas of the body that are going to be exercised. For example, avoid injecting insulin into: ? The arms, when playing tennis. ? The legs, when jogging.  Keep records of your exercise habits. Doing this can help you and your health care provider adjust your diabetes management plan as needed. Write down: ? Food that you eat before and after you exercise. ? Blood glucose levels before and after you exercise. ? The type and amount of exercise you have done. ? When your insulin is expected to peak, if you use   insulin. Avoid exercising at times when your insulin is peaking.  When you start a new exercise or activity, work with your health care provider to make sure the activity is safe for you, and to adjust your insulin, medicines, or food intake as needed.  Drink plenty of water while  you exercise to prevent dehydration or heat stroke. Drink enough fluid to keep your urine clear or pale yellow. Summary  Exercising regularly is important for your overall health, especially when you have diabetes (diabetes mellitus).  Exercising has many health benefits, such as increasing muscle strength and bone density and reducing body fat and stress.  Your health care provider or certified diabetes educator can help you make a plan for the type and frequency of exercise (activity plan) that works for you.  When you start a new exercise or activity, work with your health care provider to make sure the activity is safe for you, and to adjust your insulin, medicines, or food intake as needed. This information is not intended to replace advice given to you by your health care provider. Make sure you discuss any questions you have with your health care provider. Document Revised: 08/21/2016 Document Reviewed: 07/09/2015 Elsevier Patient Education  2020 Elsevier Inc.  

## 2019-03-16 LAB — BASIC METABOLIC PANEL
BUN/Creatinine Ratio: 12 (ref 9–20)
BUN: 12 mg/dL (ref 6–20)
CO2: 21 mmol/L (ref 20–29)
Calcium: 9.3 mg/dL (ref 8.7–10.2)
Chloride: 105 mmol/L (ref 96–106)
Creatinine, Ser: 0.99 mg/dL (ref 0.76–1.27)
GFR calc Af Amer: 111 mL/min/{1.73_m2} (ref >59)
GFR calc non Af Amer: 96 mL/min/{1.73_m2} (ref >59)
Glucose: 133 mg/dL — ABNORMAL HIGH (ref 65–99)
Potassium: 4.1 mmol/L (ref 3.5–5.2)
Sodium: 140 mmol/L (ref 134–144)

## 2019-03-18 ENCOUNTER — Telehealth: Payer: Self-pay

## 2019-03-18 NOTE — Telephone Encounter (Signed)
Called and informed patient that las are within normal range and to eat a carb modified diet and to get moderate exercise throughout the week. Asked that he keep next scheduled appointment.

## 2019-03-18 NOTE — Telephone Encounter (Signed)
-----   Message from Massie Maroon, [place] sent at 03/18/2019  3:58 PM EST ----- Regarding: lab results Please inform patient that all labs are within normal range. Continue carbohydrate modified diet and moderate cardiovascular exercise throughout the week as discussed during appointment.   Nolon Nations  APRN, MSN, FNP-C Patient Care [facility] [address] [place], [place] [address] [phone]

## 2019-05-31 ENCOUNTER — Other Ambulatory Visit: Payer: Self-pay

## 2019-05-31 DIAGNOSIS — J302 Other seasonal allergic rhinitis: Secondary | ICD-10-CM

## 2019-05-31 MED ORDER — FLUTICASONE PROPIONATE 50 MCG/ACT NA SUSP: NASAL | 3 refills | Status: DC

## 2019-09-13 ENCOUNTER — Ambulatory Visit (INDEPENDENT_AMBULATORY_CARE_PROVIDER_SITE_OTHER): Payer: BC Managed Care – PPO | Admitting: Family Medicine

## 2019-09-13 ENCOUNTER — Other Ambulatory Visit: Payer: Self-pay

## 2019-09-13 ENCOUNTER — Encounter: Payer: Self-pay | Admitting: Family Medicine

## 2019-09-13 VITALS — BP 128/79 | HR 81 | Temp 98.4°F | Resp 14 | Ht 73.0 in | Wt 267.0 lb

## 2019-09-13 DIAGNOSIS — E119 Type 2 diabetes mellitus without complications: Secondary | ICD-10-CM | POA: Diagnosis not present

## 2019-09-13 DIAGNOSIS — E669 Obesity, unspecified: Secondary | ICD-10-CM | POA: Diagnosis not present

## 2019-09-13 LAB — POCT URINALYSIS DIPSTICK
Bilirubin, UA: NEGATIVE
Blood, UA: NEGATIVE
Glucose, UA: NEGATIVE
Ketones, UA: NEGATIVE
Leukocytes, UA: NEGATIVE
Nitrite, UA: NEGATIVE
Protein, UA: NEGATIVE
Spec Grav, UA: 1.02 (ref 1.010–1.025)
Urobilinogen, UA: 0.2 E.U./dL
pH, UA: 7 (ref 5.0–8.0)

## 2019-09-13 LAB — POCT GLYCOSYLATED HEMOGLOBIN (HGB A1C): Hemoglobin A1C: 6 % — AB (ref 4.0–5.6)

## 2019-09-13 NOTE — Progress Notes (Signed)
[facility]    Established Patient Office Visit  Subjective:  Patient ID: Brian Medina, male    DOB: [birth year]  Age: 39 y.o. MRN: [id number]  CC:  Chief Complaint  Patient presents with  . Diabetes    HPI  Brian Medina is a very pleasant 39 year old male with a medical history significant for type 2 diabetes mellitus and obesity presents for 21-month follow-up of chronic conditions.  Patient states that he has been doing well and is without complaint.  States that he has not been exercising consistently or following pain low-fat, low carbohydrate diet.  He states that he continues to isolate at home due to COVID-19 pandemic.  Patient is fully vaccinated against COVID-19.  Diabetes He has type 2 diabetes mellitus. His disease course has been stable. Pertinent negatives for hypoglycemia include no confusion, dizziness, headaches, hunger, mood changes, nervousness/anxiousness, pallor, seizures, speech difficulty, sweats or tremors. Pertinent negatives for diabetes include no fatigue, no foot paresthesias, no polydipsia, no polyphagia, no polyuria, no weakness and no weight loss. Pertinent negatives for hypoglycemia complications include no blackouts and no hospitalization. Symptoms are stable. Risk factors for coronary artery disease include obesity. He is following a generally unhealthy diet. An ACE inhibitor/angiotensin II receptor blocker is being taken. He does not see a podiatrist.Eye exam is current.   Past Medical History:  Diagnosis Date  . Diabetes mellitus (HCC)     Past Surgical History:  Procedure Laterality Date  . TONSILLECTOMY      Family History  Problem Relation Age of Onset  . Diabetes Father   . Diabetes Paternal Grandfather     Social History   Socioeconomic History  . Marital status: Single    Spouse name: Not on file  . Number of children: Not on file  . Years of education: Not on file  . Highest  education level: Not on file  Occupational History  . Not on file  Tobacco Use  . Smoking status: Never Smoker  . Smokeless tobacco: Never Used  Vaping Use  . Vaping Use: Never used  Substance and Sexual Activity  . Alcohol use: Yes    Comment: occ  . Drug use: No  . Sexual activity: Yes    Birth control/protection: Condom  Other Topics Concern  . Not on file  Social History Narrative  . Not on file   Social Determinants of Health   Financial Resource Strain:   . Difficulty of Paying Living Expenses:   Food Insecurity:   . Worried About Programme researcher, broadcasting/film/video in the Last Year:   . Barista in the Last Year:   Transportation Needs:   . Freight forwarder (Medical):   Marland Kitchen Lack of Transportation (Non-Medical):   Physical Activity:   . Days of Exercise per Week:   . Minutes of Exercise per Session:   Stress:   . Feeling of Stress :   Social Connections:   . Frequency of Communication with Friends and Family:   . Frequency of Social Gatherings with Friends and Family:   . Attends Religious Services:   . Active Member of Clubs or Organizations:   . Attends Banker Meetings:   Marland Kitchen Marital Status:   Intimate Partner Violence:   . Fear of Current or Ex-Partner:   . Emotionally Abused:   Marland Kitchen Physically Abused:   . Sexually Abused:     Outpatient Medications Prior to Visit  Medication Sig Dispense  Refill  . fluticasone (FLONASE) 50 MCG/ACT nasal spray SHAKE LIQUID AND USE 2 SPRAYS IN EACH NOSTRIL DAILY 16 g 3  . metFORMIN (GLUCOPHAGE) 500 MG tablet Take 1 tablet (500 mg total) by mouth daily with breakfast. 90 tablet 1   No facility-administered medications prior to visit.    Allergies  Allergen Reactions  . Other Anaphylaxis    Mushrooms  . Mushroom Extract Complex     ROS Review of Systems  Constitutional: Negative for activity change, appetite change, fatigue and weight loss.  HENT: Negative.   Eyes: Negative.   Respiratory: Negative.     Cardiovascular: Negative.   Gastrointestinal: Negative.   Endocrine: Negative for polydipsia, polyphagia and polyuria.  Genitourinary: Negative.   Musculoskeletal: Negative.   Skin: Negative for pallor.  Neurological: Negative.  Negative for dizziness, tremors, seizures, speech difficulty, weakness and headaches.  Hematological: Negative.   Psychiatric/Behavioral: Negative.  Negative for confusion. The patient is not nervous/anxious.       Objective:    Physical Exam Constitutional:      Appearance: He is obese.  HENT:     Head: Normocephalic.  Eyes:     Pupils: Pupils are equal, round, and reactive to light.  Cardiovascular:     Rate and Rhythm: Normal rate and regular rhythm.     Pulses: Normal pulses.     Heart sounds: Normal heart sounds.  Pulmonary:     Effort: Pulmonary effort is normal.     Breath sounds: Normal breath sounds.  Abdominal:     General: Abdomen is flat. Bowel sounds are normal.  Skin:    General: Skin is warm.  Neurological:     General: No focal deficit present.     Mental Status: He is alert. Mental status is at baseline.  Psychiatric:        Mood and Affect: Mood normal.        Behavior: Behavior normal.        Thought Content: Thought content normal.        Judgment: Judgment normal.     BP 128/79 (BP Location: Left Arm, Patient Position: Sitting, Cuff Size: Large)   Pulse 81   Temp 98.4 F (36.9 C) (Oral)   Resp 14   Ht 6\' 1"  (1.854 m)   Wt 267 lb (121.1 kg)   SpO2 98%   BMI 35.23 kg/m  Wt Readings from Last 3 Encounters:  09/13/19 267 lb (121.1 kg)  03/15/19 269 lb (122 kg)  09/09/18 263 lb (119.3 kg)     Health Maintenance Due  Topic Date Due  . Hepatitis C Screening  Never done  . COVID-19 Vaccine (1) Never done  . OPHTHALMOLOGY EXAM  07/12/2019  . URINE MICROALBUMIN  09/09/2019    There are no preventive care reminders to display for this patient.  Lab Results  Component Value Date   TSH 1.00 10/30/2016   Lab  Results  Component Value Date   WBC 5.6 10/29/2015   HGB 13.6 10/29/2015   HCT 41.2 10/29/2015   MCV 88.2 10/29/2015   PLT 229 10/29/2015   Lab Results  Component Value Date   NA 140 03/15/2019   K 4.1 03/15/2019   CO2 21 03/15/2019   GLUCOSE 133 (H) 03/15/2019   BUN 12 03/15/2019   CREATININE 0.99 03/15/2019   BILITOT 0.3 11/13/2017   ALKPHOS 107 11/13/2017   AST 20 11/13/2017   ALT 31 11/13/2017   PROT 7.6 11/13/2017   ALBUMIN 4.5 11/13/2017  CALCIUM 9.3 03/15/2019   ANIONGAP 13 12/14/2014   Lab Results  Component Value Date   CHOL 152 11/13/2017   Lab Results  Component Value Date   HDL 39 (L) 11/13/2017   Lab Results  Component Value Date   LDLCALC 103 (H) 11/13/2017   Lab Results  Component Value Date   TRIG 50 11/13/2017   Lab Results  Component Value Date   CHOLHDL 4.4 10/30/2016   Lab Results  Component Value Date   HGBA1C 6.0 (A) 09/13/2019      Assessment & Plan:   Problem List Items Addressed This Visit      Endocrine   Diabetes mellitus (HCC) - Primary   Relevant Orders   Urinalysis Dipstick (Completed)   HgB A1c (Completed)      No orders of the defined types were placed in this encounter. Type 2 diabetes mellitus without complication, without long-term current use of insulin (HCC) Hemoglobin A1c is 6.0, which is consistent with follow-up.  No medication changes warranted on today.  Patient advised to start a low-fat, carbohydrate modified diet divided over small meals throughout the day.  Also, increase physical activity to 30 minutes 3-4 times per week.  The addition of diet and exercise regiment in order to achieve positive. - Urinalysis Dipstick - HgB A1c - Microalbumin/Creatinine Ratio, Urine  Obesity (BMI 30-39.9) The patient is asked to make an attempt to improve diet and exercise patterns to aid in medical management of this problem.   Follow-up: Return in about 6 months (around 03/15/2020).  Recommend comprehensive  physical exam at next appointment that includes PSA and prostate exam.   Nolon Nations  APRN, MSN, FNP-C [facility] [address] [place], [place] [address] [phone]

## 2019-09-14 LAB — MICROALBUMIN / CREATININE URINE RATIO
Creatinine, Urine: 203.8 mg/dL
Microalb/Creat Ratio: 2 mg/g creat (ref 0–29)
Microalbumin, Urine: 5 ug/mL

## 2019-09-22 ENCOUNTER — Other Ambulatory Visit: Payer: Self-pay

## 2019-09-22 DIAGNOSIS — J302 Other seasonal allergic rhinitis: Secondary | ICD-10-CM

## 2019-09-22 MED ORDER — FLUTICASONE PROPIONATE 50 MCG/ACT NA SUSP: NASAL | 3 refills | Status: DC

## 2019-12-08 ENCOUNTER — Other Ambulatory Visit: Payer: Self-pay

## 2019-12-08 ENCOUNTER — Ambulatory Visit: Payer: BC Managed Care – PPO

## 2019-12-08 VITALS — BP 120/80 | HR 80 | Temp 98.1°F | Resp 17 | Ht 72.0 in | Wt 265.8 lb

## 2019-12-08 DIAGNOSIS — Z0000 Encounter for general adult medical examination without abnormal findings: Secondary | ICD-10-CM

## 2020-03-13 ENCOUNTER — Other Ambulatory Visit: Payer: Self-pay

## 2020-03-13 ENCOUNTER — Encounter: Payer: Self-pay | Admitting: Family Medicine

## 2020-03-13 ENCOUNTER — Ambulatory Visit (INDEPENDENT_AMBULATORY_CARE_PROVIDER_SITE_OTHER): Payer: BC Managed Care – PPO | Admitting: Family Medicine

## 2020-03-13 VITALS — BP 126/84 | HR 82 | Temp 98.6°F | Ht 72.0 in | Wt 269.0 lb

## 2020-03-13 DIAGNOSIS — E119 Type 2 diabetes mellitus without complications: Secondary | ICD-10-CM | POA: Diagnosis not present

## 2020-03-13 DIAGNOSIS — E669 Obesity, unspecified: Secondary | ICD-10-CM | POA: Diagnosis not present

## 2020-03-13 LAB — POCT URINALYSIS DIPSTICK
Bilirubin, UA: NEGATIVE
Blood, UA: NEGATIVE
Glucose, UA: NEGATIVE
Ketones, UA: NEGATIVE
Leukocytes, UA: NEGATIVE
Nitrite, UA: NEGATIVE
Protein, UA: NEGATIVE
Spec Grav, UA: >=1.03 — AB (ref 1.010–1.025)
Urobilinogen, UA: 0.2 E.U./dL
pH, UA: 5.5 (ref 5.0–8.0)

## 2020-03-13 NOTE — Patient Instructions (Signed)
Diabetes Mellitus Action Plan Following a diabetes action plan is a way for you to manage your diabetes (diabetes mellitus) symptoms. The plan is color-coded to help you understand what actions you need to take based on any symptoms you are having.  If you have symptoms in the red zone, you need medical care right away.  If you have symptoms in the yellow zone, you are having problems.  If you have symptoms in the green zone, you are doing well. Learning about and understanding diabetes can take time. Follow the plan that you develop with your health care provider. Know the target range for your blood sugar (glucose) level, and review your treatment plan with your health care provider at each visit. The target range for my blood sugar level is __________________________ mg/dL. Red zone Get medical help right away if you have any of the following symptoms:  A blood sugar test result that is below 54 mg/dL (3 mmol/L).  A blood sugar test result that is at or above 240 mg/dL (13.3 mmol/L) for 2 days in a row.  Confusion or trouble thinking clearly.  Difficulty breathing.  Sickness or a fever for 2 or more days that is not getting better.  Moderate or large ketone levels in your urine.  Feeling tired or having no energy. If you have any red zone symptoms, do not wait to see if the symptoms will go away. Get medical help right away. Call your local emergency services (911 in the [place] Do not drive yourself to the hospital. If you have severely low blood sugar (severe hypoglycemia) and you cannot eat or drink, you may need glucagon. Make sure a family member or close friend knows how to check your blood sugar and how to give you glucagon. You may need to be treated in a hospital for this condition.   Yellow zone If you have any of the following symptoms, your diabetes is not under control and you may need to make some changes:  A blood sugar test result that is at or above 240 mg/dL (13.3  mmol/L) for 2 days in a row.  Blood sugar test results that are below 70 mg/dL (3.9 mmol/L).  Other symptoms of hypoglycemia, such as: ? Shaking or feeling light-headed. ? Confusion or irritability. ? Feeling hungry. ? Having a fast heartbeat. If you have any yellow zone symptoms:  Treat your hypoglycemia by eating or drinking 15 grams of a rapid-acting carbohydrate. Follow the 15:15 rule: ? Take 15 grams of a rapid-acting carbohydrate, such as:  1 tube of glucose gel.  4 glucose pills.  4 oz (120 mL) of fruit juice.  4 oz (120 mL) of regular (not diet) soda. ? Check your blood sugar 15 minutes after you take the carbohydrate. ? If the repeat blood sugar test is still at or below 70 mg/dL (3.9 mmol/L), take 15 grams of a carbohydrate again. ? If your blood sugar does not increase above 70 mg/dL (3.9 mmol/L) after 3 tries, get medical help right away. ? After your blood sugar returns to normal, eat a meal or a snack within 1 hour.  Keep taking your daily medicines as told by your health care provider.  Check your blood sugar more often than you normally would. ? Write down your results. ? Call your health care provider if you have trouble keeping your blood sugar in your target range.   Green zone These signs mean you are doing well and you can continue what you   are doing to manage your diabetes:  Your blood sugar is within your personal target range. For most people, a blood sugar level before a meal (preprandial) should be 80-130 mg/dL (4.4-7.2 mmol/L).  You feel well, and you are able to do daily activities. If you are in the green zone, continue to manage your diabetes as told by your health care provider. To do this:  Eat a healthy diet.  Exercise regularly.  Check your blood sugar as told by your health care provider.  Take your medicines as told by your health care provider.   Where to find more information  American Diabetes Association (ADA):  diabetes.org  Association of Diabetes Care & Education Specialists (ADCES): diabeteseducator.org Summary  Following a diabetes action plan is a way for you to manage your diabetes symptoms. The plan is color-coded to help you understand what actions you need to take based on any symptoms you are having.  Follow the plan that you develop with your health care provider. Make sure you know your personal target blood sugar level.  Review your treatment plan with your health care provider at each visit. This information is not intended to replace advice given to you by your health care provider. Make sure you discuss any questions you have with your health care provider. Document Revised: 08/04/2019 Document Reviewed: 08/04/2019 Elsevier Patient Education  2021 Elsevier Inc.  

## 2020-03-13 NOTE — Progress Notes (Signed)
[facility]  Established Patient Office Visit  Subjective:  Patient ID: Brian Medina, male    DOB: 1980/07/08  Age: 40 y.o. MRN: [id number]  CC:  Chief Complaint  Patient presents with  . Follow-up    HPI  Brian Medina is a 40 year old male with a medical history significant for type 2 diabetes mellitus and obesity presents for a follow up of chronic conditions.   Diabetes He has type 2 diabetes mellitus. His disease course has been stable. Pertinent negatives for hypoglycemia include no dizziness, headaches, hunger, nervousness/anxiousness, pallor or seizures. Pertinent negatives for diabetes include no blurred vision, no chest pain, no fatigue, no foot paresthesias, no polydipsia, no polyphagia, no polyuria, no weakness and no weight loss. Risk factors for coronary artery disease include obesity. Current diabetic treatment includes diet. He is compliant with treatment all of the time. He is following a generally unhealthy diet. When asked about meal planning, he reported none. He has not had a previous visit with a dietitian. He participates in exercise daily. An ACE inhibitor/angiotensin II receptor blocker is not being taken. He does not see a podiatrist.Eye exam is not current.    Past Medical History:  Diagnosis Date  . Diabetes mellitus (HCC)     Past Surgical History:  Procedure Laterality Date  . TONSILLECTOMY      Family History  Problem Relation Age of Onset  . Diabetes Father   . Diabetes Paternal Grandfather     Social History   Socioeconomic History  . Marital status: Single    Spouse name: Not on file  . Number of children: Not on file  . Years of education: Not on file  . Highest education level: Not on file  Occupational History  . Not on file  Tobacco Use  . Smoking status: Never Smoker  . Smokeless tobacco: Never Used  Vaping Use  . Vaping Use: Never used  Substance and Sexual Activity  .  Alcohol use: Yes    Comment: occ  . Drug use: No  . Sexual activity: Yes    Birth control/protection: Condom  Other Topics Concern  . Not on file  Social History Narrative  . Not on file   Social Determinants of Health   Financial Resource Strain: Not on file  Food Insecurity: Not on file  Transportation Needs: Not on file  Physical Activity: Not on file  Stress: Not on file  Social Connections: Not on file  Intimate Partner Violence: Not on file    Outpatient Medications Prior to Visit  Medication Sig Dispense Refill  . metFORMIN (GLUCOPHAGE) 500 MG tablet Take 1 tablet (500 mg total) by mouth daily with breakfast. 90 tablet 1  . fluticasone (FLONASE) 50 MCG/ACT nasal spray SHAKE LIQUID AND USE 2 SPRAYS IN EACH NOSTRIL DAILY (Patient not taking: Reported on 03/13/2020) 16 g 3   No facility-administered medications prior to visit.    Allergies  Allergen Reactions  . Other Anaphylaxis    Mushrooms  . Mushroom Extract Complex     ROS Review of Systems  Constitutional: Negative for fatigue and weight loss.  Eyes: Negative.  Negative for blurred vision.  Cardiovascular: Negative for chest pain.  Gastrointestinal: Negative.   Endocrine: Negative for polydipsia, polyphagia and polyuria.  Genitourinary: Negative.   Musculoskeletal: Negative.   Skin: Negative for pallor.  Neurological: Negative for dizziness, seizures, weakness and headaches.  Psychiatric/Behavioral: Negative.  The patient is not nervous/anxious.  Objective:    Physical Exam Constitutional:      Appearance: Normal appearance. He is obese.  Eyes:     Pupils: Pupils are equal, round, and reactive to light.  Cardiovascular:     Rate and Rhythm: Normal rate and regular rhythm.     Pulses: Normal pulses.  Pulmonary:     Effort: Pulmonary effort is normal.  Abdominal:     General: Bowel sounds are normal.  Musculoskeletal:        General: Normal range of motion.  Skin:    General: Skin is  warm.  Neurological:     General: No focal deficit present.     Mental Status: He is alert. Mental status is at baseline.  Psychiatric:        Mood and Affect: Mood normal.        Behavior: Behavior normal.        Thought Content: Thought content normal.        Judgment: Judgment normal.     BP 126/84 (BP Location: Right Arm, Patient Position: Sitting, Cuff Size: Normal)   Pulse 82   Temp 98.6 F (37 C) (Temporal)   Ht 6' (1.829 m)   Wt 269 lb (122 kg)   SpO2 98%   BMI 36.48 kg/m  Wt Readings from Last 3 Encounters:  03/13/20 269 lb (122 kg)  12/08/19 265 lb 12.8 oz (120.6 kg)  09/13/19 267 lb (121.1 kg)     Health Maintenance Due  Topic Date Due  . OPHTHALMOLOGY EXAM  07/12/2019  . COVID-19 Vaccine (3 - Booster) 11/11/2019    There are no preventive care reminders to display for this patient.  Lab Results  Component Value Date   TSH 1.00 10/30/2016   Lab Results  Component Value Date   WBC 5.6 10/29/2015   HGB 13.6 10/29/2015   HCT 41.2 10/29/2015   MCV 88.2 10/29/2015   PLT 229 10/29/2015   Lab Results  Component Value Date   NA 140 03/15/2019   K 4.1 03/15/2019   CO2 21 03/15/2019   GLUCOSE 133 (H) 03/15/2019   BUN 12 03/15/2019   CREATININE 0.99 03/15/2019   BILITOT 0.3 11/13/2017   ALKPHOS 107 11/13/2017   AST 20 11/13/2017   ALT 31 11/13/2017   PROT 7.6 11/13/2017   ALBUMIN 4.5 11/13/2017   CALCIUM 9.3 03/15/2019   ANIONGAP 13 12/14/2014   Lab Results  Component Value Date   CHOL 152 11/13/2017   Lab Results  Component Value Date   HDL 39 (L) 11/13/2017   Lab Results  Component Value Date   LDLCALC 103 (H) 11/13/2017   Lab Results  Component Value Date   TRIG 50 11/13/2017   Lab Results  Component Value Date   CHOLHDL 4.4 10/30/2016   Lab Results  Component Value Date   HGBA1C 6.0 (A) 09/13/2019      Assessment & Plan:   Problem List Items Addressed This Visit      Endocrine   Diabetes mellitus (HCC)   Relevant  Orders   Hemoglobin A1c   Basic Metabolic Panel   Lipid Panel     Other   Obesity (BMI 30-39.9) - Primary    1. Type 2 diabetes mellitus without complication, without long-term current use of insulin (HCC) - Hemoglobin A1c - Basic Metabolic Panel - Lipid Panel  2. Obesity (BMI 30-39.9) The patient is asked to make an attempt to improve diet and exercise patterns to aid in medical management  of this problem.   Follow-up: Return in about 6 months (around 09/10/2020).    Nolon Nations  APRN, MSN, FNP-C Patient Care [facility] [address] [place], [place] [address] [phone]

## 2020-03-14 LAB — BASIC METABOLIC PANEL
BUN/Creatinine Ratio: 11 (ref 9–20)
BUN: 11 mg/dL (ref 6–20)
CO2: 21 mmol/L (ref 20–29)
Calcium: 9.4 mg/dL (ref 8.7–10.2)
Chloride: 107 mmol/L — ABNORMAL HIGH (ref 96–106)
Creatinine, Ser: 1.03 mg/dL (ref 0.76–1.27)
GFR calc Af Amer: 105 mL/min/{1.73_m2} (ref >59)
GFR calc non Af Amer: 91 mL/min/{1.73_m2} (ref >59)
Glucose: 89 mg/dL (ref 65–99)
Potassium: 4.3 mmol/L (ref 3.5–5.2)
Sodium: 143 mmol/L (ref 134–144)

## 2020-03-14 LAB — LIPID PANEL
Chol/HDL Ratio: 4.1 ratio (ref 0.0–5.0)
Cholesterol, Total: 152 mg/dL (ref 100–199)
HDL: 37 mg/dL — ABNORMAL LOW (ref >39)
LDL Chol Calc (NIH): 100 mg/dL — ABNORMAL HIGH (ref 0–99)
Triglycerides: 78 mg/dL (ref 0–149)
VLDL Cholesterol Cal: 15 mg/dL (ref 5–40)

## 2020-03-14 LAB — HEMOGLOBIN A1C
Est. average glucose Bld gHb Est-mCnc: 134 mg/dL
Hgb A1c MFr Bld: 6.3 % — ABNORMAL HIGH (ref 4.8–5.6)

## 2020-03-16 ENCOUNTER — Telehealth: Payer: Self-pay

## 2020-03-16 NOTE — Telephone Encounter (Signed)
-----   Message from Massie Maroon, [place] sent at 03/16/2020  2:57 PM EST ----- Regarding: lab results Please inform patient that hemoglobin A1c has increased to 6.3 and HDL cholesterol is low.  Recommend that patient start a low-fat, low carbohydrate diet divided over small meals throughout the day.  Also, incorporate low impact cardiovascular exercise 5 days/week.  Will repeat hemoglobin A1c in 6 months.   Nolon Nations  APRN, MSN, FNP-C Patient Care [facility] [address] [place], [place] [address] [phone]

## 2020-03-16 NOTE — Telephone Encounter (Signed)
Called  When over lab results with patient ,advised pt that we follow up with lab in 6 month with an appt

## 2020-03-22 ENCOUNTER — Other Ambulatory Visit: Payer: Self-pay

## 2020-03-22 DIAGNOSIS — E119 Type 2 diabetes mellitus without complications: Secondary | ICD-10-CM

## 2020-03-22 MED ORDER — METFORMIN HCL 500 MG PO TABS: 500.0000 mg | ORAL_TABLET | Freq: Every day | ORAL | 1 refills | Status: DC

## 2020-09-11 ENCOUNTER — Ambulatory Visit: Payer: BC Managed Care – PPO | Admitting: Family Medicine

## 2020-09-20 ENCOUNTER — Other Ambulatory Visit: Payer: Self-pay

## 2020-09-20 DIAGNOSIS — E119 Type 2 diabetes mellitus without complications: Secondary | ICD-10-CM

## 2020-09-20 MED ORDER — METFORMIN HCL 500 MG PO TABS: 500.0000 mg | ORAL_TABLET | Freq: Every day | ORAL | 1 refills | Status: DC

## 2020-09-21 ENCOUNTER — Other Ambulatory Visit: Payer: Self-pay

## 2020-09-21 DIAGNOSIS — J302 Other seasonal allergic rhinitis: Secondary | ICD-10-CM

## 2020-09-21 MED ORDER — FLUTICASONE PROPIONATE 50 MCG/ACT NA SUSP: NASAL | 3 refills | Status: DC

## 2020-11-13 ENCOUNTER — Ambulatory Visit: Payer: BC Managed Care – PPO | Admitting: Family Medicine

## 2020-11-13 ENCOUNTER — Encounter: Payer: Self-pay | Admitting: Family Medicine

## 2020-11-13 ENCOUNTER — Other Ambulatory Visit: Payer: Self-pay

## 2020-11-13 VITALS — BP 132/86 | HR 103 | Temp 99.0°F | Ht 72.0 in | Wt 264.2 lb

## 2020-11-13 DIAGNOSIS — E119 Type 2 diabetes mellitus without complications: Secondary | ICD-10-CM | POA: Diagnosis not present

## 2020-11-13 DIAGNOSIS — E669 Obesity, unspecified: Secondary | ICD-10-CM | POA: Diagnosis not present

## 2020-11-13 DIAGNOSIS — E785 Hyperlipidemia, unspecified: Secondary | ICD-10-CM | POA: Diagnosis not present

## 2020-11-13 DIAGNOSIS — Z23 Encounter for immunization: Secondary | ICD-10-CM | POA: Diagnosis not present

## 2020-11-13 LAB — POCT URINALYSIS DIP (CLINITEK)
Bilirubin, UA: NEGATIVE
Blood, UA: NEGATIVE
Glucose, UA: NEGATIVE mg/dL
Ketones, POC UA: NEGATIVE mg/dL
Leukocytes, UA: NEGATIVE
Nitrite, UA: NEGATIVE
POC PROTEIN,UA: NEGATIVE
Spec Grav, UA: 1.02 (ref 1.010–1.025)
Urobilinogen, UA: 0.2 E.U./dL
pH, UA: 6 (ref 5.0–8.0)

## 2020-11-13 LAB — POCT GLYCOSYLATED HEMOGLOBIN (HGB A1C)
HbA1c POC (<> result, manual entry): 5.9 % (ref 4.0–5.6)
HbA1c, POC (controlled diabetic range): 5.9 % (ref 0.0–7.0)
HbA1c, POC (prediabetic range): 5.9 % (ref 5.7–6.4)
Hemoglobin A1C: 5.9 % — AB (ref 4.0–5.6)

## 2020-11-13 NOTE — Progress Notes (Signed)
[facility]   [facility]  Established Patient Office Visit  Subjective:  Patient ID: Brian Medina, male    DOB: 12/04/1980  Age: 40 y.o. MRN: [id number]  CC:  Chief Complaint  Patient presents with   Diabetes    Pt is here today for his follow up for his diabetes. Pt has no concerns or issues to discuss today.   Brian Medina is a 40 year old male with a medical history significant for type 2 DM, hyperlipidemia, and obesity  Diabetes He presents for his follow-up diabetic visit. He has type 2 diabetes mellitus. His disease course has been stable. Pertinent negatives for diabetes include no chest pain, no fatigue, no foot paresthesias, no polydipsia, no polyphagia, no polyuria, no weakness and no weight loss. Symptoms are stable. Pertinent negatives for diabetic complications include no peripheral neuropathy. Risk factors for coronary artery disease include obesity, sedentary lifestyle and dyslipidemia. He is compliant with treatment all of the time. He has not had a previous visit with a dietitian. He participates in exercise daily. An ACE inhibitor/angiotensin II receptor blocker is being taken. He does not see a podiatrist.Eye exam is current.  Hyperlipidemia The problem is controlled. He has no history of chronic renal disease, diabetes or obesity. Pertinent negatives include no chest pain, focal weakness or leg pain. Risk factors for coronary artery disease include obesity, a sedentary lifestyle and stress.   Past Medical History:  Diagnosis Date   Diabetes mellitus (HCC)     Past Surgical History:  Procedure Laterality Date   TONSILLECTOMY      Family History  Problem Relation Age of Onset   Diabetes Father    Diabetes Paternal Grandfather     Social History   Socioeconomic History   Marital status: Single    Spouse name: Not on file   Number of children: Not on file    Years of education: Not on file   Highest education level: Not on file  Occupational History   Not on file  Tobacco Use   Smoking status: Never   Smokeless tobacco: Never  Vaping Use   Vaping Use: Never used  Substance and Sexual Activity   Alcohol use: Yes    Comment: occ   Drug use: No   Sexual activity: Yes    Birth control/protection: Condom  Other Topics Concern   Not on file  Social History Narrative   Not on file   Social Determinants of Health   Financial Resource Strain: Not on file  Food Insecurity: Not on file  Transportation Needs: Not on file  Physical Activity: Not on file  Stress: Not on file  Social Connections: Not on file  Intimate Partner Violence: Not on file    Outpatient Medications Prior to Visit  Medication Sig Dispense Refill   fluticasone (FLONASE) 50 MCG/ACT nasal spray SHAKE LIQUID AND USE 2 SPRAYS IN EACH NOSTRIL DAILY 16 g 3   metFORMIN (GLUCOPHAGE) 500 MG tablet Take 1 tablet (500 mg total) by mouth daily with breakfast. 90 tablet 1   No facility-administered medications prior to visit.    Allergies  Allergen Reactions   Other Anaphylaxis    Mushrooms   Mushroom Extract Complex     ROS Review of Systems  Constitutional: Negative.  Negative for fatigue and weight loss.  HENT: Negative.    Eyes: Negative.  Negative for photophobia and visual disturbance.  Respiratory: Negative.  Cardiovascular: Negative.  Negative for chest pain.  Gastrointestinal: Negative.   Endocrine: Negative for polydipsia, polyphagia and polyuria.  Genitourinary: Negative.   Musculoskeletal: Negative.   Skin: Negative.   Neurological: Negative.  Negative for focal weakness and weakness.  Psychiatric/Behavioral: Negative.       Objective:    Physical Exam Constitutional:      Appearance: He is obese.  Eyes:     Pupils: Pupils are equal, round, and reactive to light.  Cardiovascular:     Rate and Rhythm: Normal rate and regular rhythm.      Pulses: Normal pulses.  Pulmonary:     Effort: Pulmonary effort is normal.  Abdominal:     General: Abdomen is flat. Bowel sounds are normal.  Musculoskeletal:        General: Normal range of motion.  Skin:    General: Skin is warm.  Neurological:     General: No focal deficit present.     Mental Status: He is alert. Mental status is at baseline.  Psychiatric:        Mood and Affect: Mood normal.        Behavior: Behavior normal.        Thought Content: Thought content normal.        Judgment: Judgment normal.    BP 132/86   Pulse (!) 103   Temp 99 F (37.2 C)   Ht 6' (1.829 m)   Wt 264 lb 3.2 oz (119.8 kg)   SpO2 93%   BMI 35.83 kg/m  Wt Readings from Last 3 Encounters:  11/13/20 264 lb 3.2 oz (119.8 kg)  03/13/20 269 lb (122 kg)  12/08/19 265 lb 12.8 oz (120.6 kg)     Health Maintenance Due  Topic Date Due   Hepatitis C Screening  Never done   OPHTHALMOLOGY EXAM  07/12/2019   FOOT EXAM  03/14/2020   URINE MICROALBUMIN  09/12/2020    There are no preventive care reminders to display for this patient.  Lab Results  Component Value Date   TSH 1.00 10/30/2016   Lab Results  Component Value Date   WBC 5.6 10/29/2015   HGB 13.6 10/29/2015   HCT 41.2 10/29/2015   MCV 88.2 10/29/2015   PLT 229 10/29/2015   Lab Results  Component Value Date   NA 143 03/13/2020   K 4.3 03/13/2020   CO2 21 03/13/2020   GLUCOSE 89 03/13/2020   BUN 11 03/13/2020   CREATININE 1.03 03/13/2020   BILITOT 0.3 11/13/2017   ALKPHOS 107 11/13/2017   AST 20 11/13/2017   ALT 31 11/13/2017   PROT 7.6 11/13/2017   ALBUMIN 4.5 11/13/2017   CALCIUM 9.4 03/13/2020   ANIONGAP 13 12/14/2014   Lab Results  Component Value Date   CHOL 152 03/13/2020   Lab Results  Component Value Date   HDL 37 (L) 03/13/2020   Lab Results  Component Value Date   LDLCALC 100 (H) 03/13/2020   Lab Results  Component Value Date   TRIG 78 03/13/2020   Lab Results  Component Value Date    CHOLHDL 4.1 03/13/2020   Lab Results  Component Value Date   HGBA1C 5.9 (A) 11/13/2020   HGBA1C 5.9 11/13/2020   HGBA1C 5.9 11/13/2020   HGBA1C 5.9 11/13/2020      Assessment & Plan:   Problem List Items Addressed This Visit       Endocrine   Diabetes mellitus (HCC) - Primary   Relevant Orders   POCT  URINALYSIS DIP (CLINITEK) (Completed)   HgB A1c (Completed)   1. Type 2 diabetes mellitus without complication, without long-term current use of insulin (HCC)  - POCT URINALYSIS DIP (CLINITEK) - HgB A1c - Lipid Panel - Comprehensive metabolic panel - Microalbumin/Creatinine Ratio, Urine  2. Obesity (BMI 30-39.9) The patient is asked to make an attempt to improve diet and exercise patterns to aid in medical management of this problem.   3. Hyperlipidemia, unspecified hyperlipidemia type The ASCVD Risk score (Arnett DK, et al., 2019) failed to calculate for the following reasons:   Unable to determine if patient is Non-Hispanic African American  - Lipid Panel  4. Influenza vaccine needed  - Flu Vaccine QUAD 36+ mos IM (Fluarix, Fluzone & Afluria Quad PF  Follow-up: Return in about 6 months (around 05/14/2021) for diabetes.    Nolon Nations  APRN, MSN, FNP-C [facility] [address] [place], [place] [address] [phone]

## 2020-11-13 NOTE — Patient Instructions (Addendum)
Thanks for coming in today. It was a pleasure to talk to you as always. Please continue on your ventures, your pictures are great.  Today, your hemoglobin a1c is 5.9, which is much improved from previous. I will follow up with you by phone with your lab results.    Diabetes Mellitus and Exercise Exercising regularly is important for overall health, especially for people who have diabetes mellitus. Exercising is not only about losing weight. It has many other health benefits, such as increasing muscle strength and bone density and reducing body fat and stress. This leads to improved fitness, flexibility, and endurance, all of which result in better overall health. What are the benefits of exercise if I have diabetes? Exercise has many benefits for people with diabetes. They include: Helping to lower and control blood sugar (glucose). Helping the body to respond better to the hormone insulin by improving insulin sensitivity. Reducing how much insulin the body needs. Lowering the risk for heart disease by: Lowering "bad" cholesterol and triglyceride levels. Increasing "good" cholesterol levels. Lowering blood pressure. Lowering blood glucose levels. What is my activity plan? Your health care provider or certified diabetes educator can help you make a plan for the type and frequency of exercise that works for you. This is called your activity plan. Be sure to: Get at least 150 minutes of medium-intensity or high-intensity exercise each week. Exercises may include brisk walking, biking, or water aerobics. Do stretching and strengthening exercises, such as yoga or weight lifting, at least 2 times a week. Spread out your activity over at least 3 days of the week. Get some form of physical activity each day. Do not go more than 2 days in a row without some kind of physical activity. Avoid being inactive for more than 90 minutes at a time. Take frequent breaks to walk or stretch. Choose exercises or  activities that you enjoy. Set realistic goals. Start slowly and gradually increase your exercise intensity over time. How do I manage my diabetes during exercise? Monitor your blood glucose Check your blood glucose before and after exercising. If your blood glucose is: 240 mg/dL (61.4 mmol/L) or higher before you exercise, check your urine for ketones. These are chemicals created by the liver. If you have ketones in your urine, do not exercise until your blood glucose returns to normal. 100 mg/dL (5.6 mmol/L) or lower, eat a snack containing 15-20 grams of carbohydrate. Check your blood glucose 15 minutes after the snack to make sure that your glucose level is above 100 mg/dL (5.6 mmol/L) before you start your exercise. Know the symptoms of low blood glucose (hypoglycemia) and how to treat it. Your risk for hypoglycemia increases during and after exercise. Follow these tips and your health care provider's instructions Keep a carbohydrate snack that is fast-acting for use before, during, and after exercise to help prevent or treat hypoglycemia. Avoid injecting insulin into areas of the body that are going to be exercised. For example, avoid injecting insulin into: Your arms, when you are about to play tennis. Your legs, when you are about to go jogging. Keep records of your exercise habits. Doing this can help you and your health care provider adjust your diabetes management plan as needed. Write down: Food that you eat before and after you exercise. Blood glucose levels before and after you exercise. The type and amount of exercise you have done. Work with your health care provider when you start a new exercise or activity. He or she may  need to: Make sure that the activity is safe for you. Adjust your insulin, other medicines, and food that you eat. Drink plenty of water while you exercise. This prevents loss of water (dehydration) and problems caused by a lot of heat in the body (heat  stroke). Where to find more information American Diabetes Association: www.diabetes.org Summary Exercising regularly is important for overall health, especially for people who have diabetes mellitus. Exercising has many health benefits. It increases muscle strength and bone density and reduces body fat and stress. It also lowers and controls blood glucose. Your health care provider or certified diabetes educator can help you make an activity plan for the type and frequency of exercise that works for you. Work with your health care provider to make sure any new activity is safe for you. Also work with your health care provider to adjust your insulin, other medicines, and the food you eat. This information is not intended to replace advice given to you by your health care provider. Make sure you discuss any questions you have with your health care provider. Document Revised: 10/25/2018 Document Reviewed: 10/25/2018 Elsevier Patient Education  2022 [employer].

## 2020-11-14 LAB — LIPID PANEL
Chol/HDL Ratio: 4.2 ratio (ref 0.0–5.0)
Cholesterol, Total: 135 mg/dL (ref 100–199)
HDL: 32 mg/dL — ABNORMAL LOW (ref >39)
LDL Chol Calc (NIH): 80 mg/dL (ref 0–99)
Triglycerides: 127 mg/dL (ref 0–149)
VLDL Cholesterol Cal: 23 mg/dL (ref 5–40)

## 2020-11-14 LAB — MICROALBUMIN / CREATININE URINE RATIO
Creatinine, Urine: 153.4 mg/dL
Microalb/Creat Ratio: 3 mg/g creat (ref 0–29)
Microalbumin, Urine: 4.3 ug/mL

## 2020-11-14 LAB — COMPREHENSIVE METABOLIC PANEL
ALT: 22 IU/L (ref 0–44)
AST: 18 IU/L (ref 0–40)
Albumin/Globulin Ratio: 1.6 (ref 1.2–2.2)
Albumin: 4.5 g/dL (ref 4.0–5.0)
Alkaline Phosphatase: 103 IU/L (ref 44–121)
BUN/Creatinine Ratio: 11 (ref 9–20)
BUN: 10 mg/dL (ref 6–24)
Bilirubin Total: 0.4 mg/dL (ref 0.0–1.2)
CO2: 18 mmol/L — ABNORMAL LOW (ref 20–29)
Calcium: 9.3 mg/dL (ref 8.7–10.2)
Chloride: 105 mmol/L (ref 96–106)
Creatinine, Ser: 0.91 mg/dL (ref 0.76–1.27)
Globulin, Total: 2.9 g/dL (ref 1.5–4.5)
Glucose: 107 mg/dL — ABNORMAL HIGH (ref 70–99)
Potassium: 4 mmol/L (ref 3.5–5.2)
Sodium: 141 mmol/L (ref 134–144)
Total Protein: 7.4 g/dL (ref 6.0–8.5)
eGFR: 109 mL/min/{1.73_m2} (ref >59)

## 2021-02-20 ENCOUNTER — Other Ambulatory Visit: Payer: Self-pay

## 2021-02-20 DIAGNOSIS — J302 Other seasonal allergic rhinitis: Secondary | ICD-10-CM

## 2021-02-20 MED ORDER — FLUTICASONE PROPIONATE 50 MCG/ACT NA SUSP: NASAL | 3 refills | Status: DC

## 2021-03-22 ENCOUNTER — Other Ambulatory Visit: Payer: Self-pay | Admitting: Family Medicine

## 2021-03-22 DIAGNOSIS — E119 Type 2 diabetes mellitus without complications: Secondary | ICD-10-CM

## 2021-05-14 ENCOUNTER — Ambulatory Visit: Payer: Managed Care, Other (non HMO) | Admitting: Family Medicine

## 2021-05-14 ENCOUNTER — Encounter: Payer: Self-pay | Admitting: Family Medicine

## 2021-05-14 VITALS — BP 131/91 | HR 85 | Temp 97.5°F | Ht 72.0 in | Wt 277.1 lb

## 2021-05-14 DIAGNOSIS — E669 Obesity, unspecified: Secondary | ICD-10-CM | POA: Diagnosis not present

## 2021-05-14 DIAGNOSIS — E119 Type 2 diabetes mellitus without complications: Secondary | ICD-10-CM | POA: Diagnosis not present

## 2021-05-14 LAB — POCT GLYCOSYLATED HEMOGLOBIN (HGB A1C)
HbA1c POC (<> result, manual entry): 6.4 % (ref 4.0–5.6)
HbA1c, POC (controlled diabetic range): 6.4 % (ref 0.0–7.0)
HbA1c, POC (prediabetic range): 6.4 % (ref 5.7–6.4)
Hemoglobin A1C: 6.4 % — AB (ref 4.0–5.6)

## 2021-05-14 LAB — POCT URINALYSIS DIP (CLINITEK)
Bilirubin, UA: NEGATIVE
Blood, UA: NEGATIVE
Glucose, UA: NEGATIVE mg/dL
Ketones, POC UA: NEGATIVE mg/dL
Leukocytes, UA: NEGATIVE
Nitrite, UA: NEGATIVE
POC PROTEIN,UA: NEGATIVE
Spec Grav, UA: 1.025 (ref 1.010–1.025)
Urobilinogen, UA: 0.2 E.U./dL
pH, UA: 6.5 (ref 5.0–8.0)

## 2021-05-14 NOTE — Progress Notes (Signed)
?[facility]  ? ?Established Patient Office Visit ? ?Subjective:  ?Patient ID: Kaiven Vester, male    DOB: 02-06-1981  Age: 41 y.o. MRN: [id number] ? ?CC:  ?Chief Complaint  ?Patient presents with  ? Follow-up  ?  Pt is here for 6 month follow up. No issues or concerns  ? ? ?HPI ?Yousif Edelson is a 41 year old male with a medical history significant for type 2 DM and obesity presents for a 43-monthfollow-up of chronic conditions.  Patient has been doing very well and is without complaint.  Patient states that he has been taking prescribed medication consistently.  He has not been exercising or following a low carbohydrate diet.  He has been eating a great deal of fast food since he has been in the process of moving over the past month.  Patient currently denies any polyuria, polydipsia, or polyphagia.  No blurred vision or dizziness.  No nausea, vomiting, or diarrhea.  Patient is up-to-date with yearly eye exam. ? ?Past Medical History:  ?Diagnosis Date  ? [place]   ? ? ?Past Surgical History:  ?Procedure Laterality Date  ? TONSILLECTOMY    ? ? ?Family History  ?Problem Relation Age of Onset  ? Diabetes Father   ? Diabetes Paternal Grandfather   ? ? ?Social History  ? ?Socioeconomic History  ? Marital status: Single  ?  Spouse name: Not on file  ? Number of children: Not on file  ? Years of education: Not on file  ? Highest education level: Not on file  ?Occupational History  ? Not on file  ?Tobacco Use  ? Smoking status: Never  ? Smokeless tobacco: Never  ?Vaping Use  ? Vaping Use: Never used  ?Substance and Sexual Activity  ? Alcohol use: Yes  ?  Comment: occ  ? Drug use: No  ? Sexual activity: Yes  ?  Birth control/protection: Condom  ?Other Topics Concern  ? Not on file  ?Social History Narrative  ? Not on file  ? ?Social Determinants of Health  ? ?Financial Resource Strain: Not on file  ?Food Insecurity: Not on file  ?Transportation Needs: Not  on file  ?Physical Activity: Not on file  ?Stress: Not on file  ?Social Connections: Not on file  ?Intimate Partner Violence: Not on file  ? ? ?Outpatient Medications Prior to Visit  ?Medication Sig Dispense Refill  ? fluticasone (FLONASE) 50 MCG/ACT nasal spray SHAKE LIQUID AND USE 2 SPRAYS IN EACH NOSTRIL DAILY 16 g 3  ? metFORMIN (GLUCOPHAGE) 500 MG tablet TAKE 1 TABLET(500 MG) BY MOUTH DAILY WITH BREAKFAST 90 tablet 1  ? ?No facility-administered medications prior to visit.  ? ? ?Allergies  ?Allergen Reactions  ? Other Anaphylaxis  ?  Mushrooms  ? Mushroom Extract Complex   ? ? ?ROS ?Review of Systems  ?Constitutional: Negative.   ?HENT: Negative.    ?Eyes: Negative.   ?Respiratory: Negative.    ?Gastrointestinal: Negative.   ?Endocrine: Negative.  Negative for polydipsia, polyphagia and polyuria.  ?Genitourinary: Negative.   ?Musculoskeletal: Negative.   ?Skin: Negative.   ?Neurological: Negative.   ?Psychiatric/Behavioral: Negative.    ? ?  ?Objective:  ?  ?Physical Exam ?Constitutional:   ?   Appearance: He is obese.  ?Eyes:  ?   Pupils: Pupils are equal, round, and reactive to light.  ?Cardiovascular:  ?   Rate and Rhythm: Normal rate.  ?   Pulses: Normal pulses.  ?  Pulmonary:  ?   Effort: Pulmonary effort is normal.  ?Abdominal:  ?   General: Bowel sounds are normal.  ?Musculoskeletal:     ?   General: Normal range of motion.  ?Skin: ?   General: Skin is warm.  ?Neurological:  ?   General: No focal deficit present.  ?   Mental Status: He is alert. Mental status is at baseline.  ?Psychiatric:     ?   Mood and Affect: Mood normal.     ?   Behavior: Behavior normal.     ?   Thought Content: Thought content normal.     ?   Judgment: Judgment normal.  ? ? ?BP (!) 131/91 (BP Location: Left Arm, Patient Position: Sitting, Cuff Size: Large)   Pulse 85   Temp (!) 97.5 ?F (36.4 ?C)   Ht 6' (1.829 m)   Wt 277 lb 2 oz (125.7 kg)   SpO2 100%   BMI 37.58 kg/m?  ?Wt Readings from Last 3 Encounters:  ?05/14/21 277 lb  2 oz (125.7 kg)  ?11/13/20 264 lb 3.2 oz (119.8 kg)  ?03/13/20 269 lb (122 kg)  ? ? ? ?Health Maintenance Due  ?Topic Date Due  ? Hepatitis C Screening  Never done  ? OPHTHALMOLOGY EXAM  07/12/2019  ? COVID-19 Vaccine (4 - Booster) 04/18/2020  ? ? ?There are no preventive care reminders to display for this patient. ? ?Lab Results  ?Component Value Date  ? TSH 1.00 10/30/2016  ? ?Lab Results  ?Component Value Date  ? WBC 5.6 10/29/2015  ? HGB 13.6 10/29/2015  ? HCT 41.2 10/29/2015  ? MCV 88.2 10/29/2015  ? PLT 229 10/29/2015  ? ?Lab Results  ?Component Value Date  ? NA 141 11/13/2020  ? K 4.0 11/13/2020  ? CO2 18 (L) 11/13/2020  ? GLUCOSE 107 (H) 11/13/2020  ? BUN 10 11/13/2020  ? CREATININE 0.91 11/13/2020  ? BILITOT 0.4 11/13/2020  ? ALKPHOS 103 11/13/2020  ? AST 18 11/13/2020  ? ALT 22 11/13/2020  ? PROT 7.4 11/13/2020  ? ALBUMIN 4.5 11/13/2020  ? CALCIUM 9.3 11/13/2020  ? ANIONGAP 13 12/14/2014  ? EGFR 109 11/13/2020  ? ?Lab Results  ?Component Value Date  ? CHOL 135 11/13/2020  ? ?Lab Results  ?Component Value Date  ? HDL 32 (L) 11/13/2020  ? ?Lab Results  ?Component Value Date  ? [place] 80 11/13/2020  ? ?Lab Results  ?Component Value Date  ? TRIG 127 11/13/2020  ? ?Lab Results  ?Component Value Date  ? CHOLHDL 4.2 11/13/2020  ? ?Lab Results  ?Component Value Date  ? HGBA1C 6.4 (A) 05/14/2021  ? HGBA1C 6.4 05/14/2021  ? HGBA1C 6.4 05/14/2021  ? HGBA1C 6.4 05/14/2021  ? ? ?  ?Assessment & Plan:  ? ?Problem List Items Addressed This Visit   ? ?  ? Endocrine  ? Diabetes mellitus ([place]) - Primary  ? Relevant Orders  ? POCT URINALYSIS DIP (CLINITEK)  ? POCT glycosylated hemoglobin (Hb A1C) (Completed)  ? ? ?1. Type 2 diabetes mellitus without complication, without long-term current use of insulin ([place]) ?Patient's hemoglobin A1c has increased to 6.4.  Discussed diet and exercise regimen at length.  Patient's goal is to lose 10% of his body weight over the next 6 months, which is approximately 28 pounds.  Advised to  start with a 4-5 carbohydrate modified small meals throughout the day.  Also, 150 minutes of cardiovascular exercise, low impact per week. ?- POCT URINALYSIS DIP (  CLINITEK) ?- POCT glycosylated hemoglobin (Hb A1C) ?- CMP and Liver ? ?2. Obesity (BMI 30-39.9) ?The patient is asked to make an attempt to improve diet and exercise patterns to aid in medical management of this problem. ? ? ?Follow-up: Return in about 6 months (around 11/13/2021) for diabetes.  ? ? ? ?Donia Pounds  APRN, MSN, FNP-C ?[facility] ?[address]  ?[place],  [address] ?[phone] ? ?

## 2021-05-14 NOTE — Patient Instructions (Addendum)
Please read about ozempic. It may be a good option going forward.  ? ?Semaglutide Injection ?What is this medication? ?SEMAGLUTIDE (SEM a GLOO tide) treats type 2 diabetes. It works by increasing insulin levels in your body, which decreases your blood sugar (glucose). It also reduces the amount of sugar released into the blood and slows down your digestion. It can also be used to lower the risk of heart attack and stroke in people with type 2 diabetes. Changes to diet and exercise are often combined with this medication. ?This medicine may be used for other purposes; ask your health care provider or pharmacist if you have questions. ?COMMON BRAND NAME(S): OZEMPIC ?What should I tell my care team before I take this medication? ?They need to know if you have any of these conditions: ?Endocrine tumors (MEN 2) or if someone in your family had these tumors ?Eye disease, vision problems ?History of pancreatitis ?Kidney disease ?Stomach problems ?Thyroid cancer or if someone in your family had thyroid cancer ?An unusual or allergic reaction to semaglutide, other medications, foods, dyes, or preservatives ?Pregnant or trying to get pregnant ?Breast-feeding ?How should I use this medication? ?This medication is for injection under the skin of your upper leg (thigh), stomach area, or upper arm. It is given once every week (every 7 days). You will be taught how to prepare and give this medication. Use exactly as directed. Take your medication at regular intervals. Do not take it more often than directed. ?If you use this medication with insulin, you should inject this medication and the insulin separately. Do not mix them together. Do not give the injections right next to each other. Change (rotate) injection sites with each injection. ?It is important that you put your used needles and syringes in a special sharps container. Do not put them in a trash can. If you do not have a sharps container, call your pharmacist or care  team to get one. ?A special MedGuide will be given to you by the pharmacist with each prescription and refill. Be sure to read this information carefully each time. ?This medication comes with INSTRUCTIONS FOR USE. Ask your pharmacist for directions on how to use this medication. Read the information carefully. Talk to your pharmacist or care team if you have questions. ?Talk to your care team about the use of this medication in children. Special care may be needed. ?Overdosage: If you think you have taken too much of this medicine contact a poison control center or emergency room at once. ?NOTE: This medicine is only for you. Do not share this medicine with others. ?What if I miss a dose? ?If you miss a dose, take it as soon as you can within 5 days after the missed dose. Then take your next dose at your regular weekly time. If it has been longer than 5 days after the missed dose, do not take the missed dose. Take the next dose at your regular time. Do not take double or extra doses. If you have questions about a missed dose, contact your care team for advice. ?What may interact with this medication? ?Other medications for diabetes ?Many medications may cause changes in blood sugar, these include: ?Alcohol containing beverages ?Antiviral medications for HIV or AIDS ?Aspirin and aspirin-like medications ?Certain medications for blood pressure, heart disease, irregular heart beat ?Chromium ?Diuretics ?Male hormones, such as estrogens or progestins, birth control pills ?Fenofibrate ?Gemfibrozil ?Isoniazid ?Lanreotide ?Male hormones or anabolic steroids ?MAOIs like Carbex, Eldepryl, Marplan, Nardil, and Parnate ?  Medications for weight loss ?Medications for allergies, asthma, cold, or cough ?Medications for depression, anxiety, or psychotic disturbances ?Niacin ?Nicotine ?NSAIDs, medications for pain and inflammation, like ibuprofen or naproxen ?Octreotide ?Pasireotide ?Pentamidine ?Phenytoin ?Probenecid ?Quinolone  antibiotics such as ciprofloxacin, levofloxacin, ofloxacin ?Some herbal dietary supplements ?Steroid medications such as prednisone or cortisone ?Sulfamethoxazole; trimethoprim ?Thyroid hormones ?Some medications can hide the warning symptoms of low blood sugar (hypoglycemia). You may need to monitor your blood sugar more closely if you are taking one of these medications. These include: ?Beta-blockers, often used for high blood pressure or heart problems (examples include atenolol, metoprolol, propranolol) ?Clonidine ?Guanethidine ?Reserpine ?This list may not describe all possible interactions. Give your health care provider a list of all the medicines, herbs, non-prescription drugs, or dietary supplements you use. Also tell them if you smoke, drink alcohol, or use illegal drugs. Some items may interact with your medicine. ?What should I watch for while using this medication? ?Visit your care team for regular checks on your progress. ?Drink plenty of fluids while taking this medication. Check with your care team if you get an attack of severe diarrhea, nausea, and vomiting. The loss of too much body fluid can make it dangerous for you to take this medication. ?A test called the HbA1C (A1C) will be monitored. This is a simple blood test. It measures your blood sugar control over the last 2 to 3 months. You will receive this test every 3 to 6 months. ?Learn how to check your blood sugar. Learn the symptoms of low and high blood sugar and how to manage them. ?Always carry a quick-source of sugar with you in case you have symptoms of low blood sugar. Examples include hard sugar candy or glucose tablets. Make sure others know that you can choke if you eat or drink when you develop serious symptoms of low blood sugar, such as seizures or unconsciousness. They must get medical help at once. ?Tell your care team if you have high blood sugar. You might need to change the dose of your medication. If you are sick or  exercising more than usual, you might need to change the dose of your medication. ?Do not skip meals. Ask your care team if you should avoid alcohol. Many nonprescription cough and cold products contain sugar or alcohol. These can affect blood sugar. ?Pens should never be shared. Even if the needle is changed, sharing may result in passing of viruses like hepatitis or HIV. ?Wear a medical ID bracelet or chain, and carry a card that describes your disease and details of your medication and dosage times. ?Do not become pregnant while taking this medication. Women should inform their care team if they wish to become pregnant or think they might be pregnant. There is a potential for serious side effects to an unborn child. Talk to your care team for more information. ?What side effects may I notice from receiving this medication? ?Side effects that you should report to your care team as soon as possible: ?Allergic reactions--skin rash, itching, hives, swelling of the face, lips, tongue, or throat ?Change in vision ?Dehydration--increased thirst, dry mouth, feeling faint or lightheaded, headache, dark yellow or brown urine ?Gallbladder problems--severe stomach pain, nausea, vomiting, fever ?Heart palpitations--rapid, pounding, or irregular heartbeat ?Kidney injury--decrease in the amount of urine, swelling of the ankles, hands, or feet ?Pancreatitis--severe stomach pain that spreads to your back or gets worse after eating or when touched, fever, nausea, vomiting ?Thyroid cancer--new mass or lump in the neck, pain  or trouble swallowing, trouble breathing, hoarseness ?Side effects that usually do not require medical attention (report to your care team if they continue or are bothersome): ?Diarrhea ?Loss of appetite ?Nausea ?Stomach pain ?Vomiting ?This list may not describe all possible side effects. Call your doctor for medical advice about side effects. You may report side effects to FDA at 1-800-FDA-1088. ?Where should  I keep my medication? ?Keep out of the reach of children. ?Store unopened pens in a refrigerator between 2 and 8 degrees C (36 and 46 degrees F). Do not freeze. Protect from light and heat. After you first use th

## 2021-05-15 LAB — CMP AND LIVER
ALT: 31 IU/L (ref 0–44)
AST: 28 IU/L (ref 0–40)
Albumin: 4.7 g/dL (ref 4.0–5.0)
Alkaline Phosphatase: 122 IU/L — ABNORMAL HIGH (ref 44–121)
BUN: 13 mg/dL (ref 6–24)
Bilirubin Total: 0.6 mg/dL (ref 0.0–1.2)
Bilirubin, Direct: 0.13 mg/dL (ref 0.00–0.40)
CO2: 21 mmol/L (ref 20–29)
Calcium: 9.5 mg/dL (ref 8.7–10.2)
Chloride: 101 mmol/L (ref 96–106)
Creatinine, Ser: 0.91 mg/dL (ref 0.76–1.27)
Glucose: 105 mg/dL — ABNORMAL HIGH (ref 70–99)
Potassium: 4.2 mmol/L (ref 3.5–5.2)
Sodium: 137 mmol/L (ref 134–144)
Total Protein: 7.3 g/dL (ref 6.0–8.5)
eGFR: 109 mL/min/{1.73_m2} (ref >59)

## 2021-06-20 ENCOUNTER — Other Ambulatory Visit: Payer: Self-pay

## 2021-06-20 DIAGNOSIS — J302 Other seasonal allergic rhinitis: Secondary | ICD-10-CM

## 2021-06-20 MED ORDER — FLUTICASONE PROPIONATE 50 MCG/ACT NA SUSP: NASAL | 3 refills | Status: AC

## 2021-09-03 ENCOUNTER — Telehealth: Payer: Self-pay

## 2021-09-03 DIAGNOSIS — E119 Type 2 diabetes mellitus without complications: Secondary | ICD-10-CM

## 2021-09-03 MED ORDER — METFORMIN HCL 500 MG PO TABS: ORAL_TABLET | ORAL | 1 refills | Status: AC

## 2021-09-03 NOTE — Telephone Encounter (Signed)
No additional notes needed  

## 2021-11-19 ENCOUNTER — Ambulatory Visit: Payer: Managed Care, Other (non HMO) | Admitting: Family Medicine

## 2023-03-14 DEATH — deceased
